# Patient Record
Sex: Male | Born: 1970 | Race: White | Hispanic: No | Marital: Single | Smoking: Never smoker
Health system: Southern US, Community
[De-identification: ages and names within clinical notes are randomized; demographics above are authoritative.]

---

## 2013-01-09 ENCOUNTER — Encounter: Payer: Self-pay | Admitting: Sports Medicine

## 2013-01-09 ENCOUNTER — Ambulatory Visit (INDEPENDENT_AMBULATORY_CARE_PROVIDER_SITE_OTHER): Payer: BC Managed Care – PPO | Admitting: Sports Medicine

## 2013-01-09 VITALS — BP 124/75 | HR 65 | Ht 70.0 in | Wt 238.0 lb

## 2013-01-09 DIAGNOSIS — Z299 Encounter for prophylactic measures, unspecified: Secondary | ICD-10-CM | POA: Insufficient documentation

## 2013-01-09 DIAGNOSIS — G4700 Insomnia, unspecified: Secondary | ICD-10-CM

## 2013-01-09 DIAGNOSIS — R6882 Decreased libido: Secondary | ICD-10-CM

## 2013-01-09 DIAGNOSIS — G479 Sleep disorder, unspecified: Secondary | ICD-10-CM | POA: Insufficient documentation

## 2013-01-09 DIAGNOSIS — E669 Obesity, unspecified: Secondary | ICD-10-CM | POA: Insufficient documentation

## 2013-01-09 DIAGNOSIS — N529 Male erectile dysfunction, unspecified: Secondary | ICD-10-CM | POA: Insufficient documentation

## 2013-01-09 MED ORDER — PHENTERMINE HCL 37.5 MG PO TABS: 37.5000 mg | ORAL_TABLET | Freq: Every day | ORAL | Status: DC

## 2013-01-09 MED ORDER — HYDROXYZINE HCL 50 MG PO TABS: 25.0000 mg | ORAL_TABLET | Freq: Every evening | ORAL | Status: DC | PRN

## 2013-01-09 MED ORDER — MELATONIN 10 MG PO TABS: 10.0000 mg | ORAL_TABLET | Freq: Every evening | ORAL | Status: DC

## 2013-01-09 NOTE — Patient Instructions (Addendum)

## 2013-01-09 NOTE — Progress Notes (Signed)
  Subjective:    CC: Establish care.   HPI: This is a pleasant healthy male who wants to discuss insomnia.  Insomnia: Lays in bed by 9 to 10:00, watches TV for approximately 3-4 hours, lays in bed for another couple of hours trying to sleep, sleeps for a couple 2-3 hours, and then awakens.  Never feels well rested through the day. He tried melatonin but takes it in the middle of the night. Symptoms are moderate, persistent. Is not tried anything else to help him sleep. Is not waking up to use the bathroom through the night.  Low sex drive: Has a young girl friend in her 18s, notes that work interferes significantly with his sex drive, he owns a Estate manager/land agent. He has taken 5 phosphodiesterase inhibitors in the past which were effective in improving his sex drive. When he is on vacation with his girlfriend, sex drive is okay. He did have testosterone levels checked in the recent past and they were normal.  Obesity: Has tried working out, dieting, would like assistance in weight loss.  Past medical history, Surgical history, Family history not pertinant except as noted below, Social history, Allergies, and medications have been entered into the medical record, reviewed, and no changes needed.   Review of Systems: No headache, visual changes, nausea, vomiting, diarrhea, constipation, dizziness, abdominal pain, skin rash, fevers, chills, night sweats, swollen lymph nodes, weight loss, chest pain, body aches, joint swelling, muscle aches, shortness of breath, mood changes, visual or auditory hallucinations.  Objective:    General: Well Developed, well nourished, and in no acute distress.  Neuro: Alert and oriented x3, extra-ocular muscles intact, sensation grossly intact.  HEENT: Normocephalic, atraumatic, pupils equal round reactive to light, neck supple, no masses, no lymphadenopathy, thyroid nonpalpable.  Skin: Warm and dry, no rashes noted.  Cardiac: Regular rate and rhythm, no murmurs rubs or  gallops.  Respiratory: Clear to auscultation bilaterally. Not using accessory muscles, speaking in full sentences.  Abdominal: Soft, nontender, nondistended, positive bowel sounds, no masses, no organomegaly.  Musculoskeletal: Shoulder, elbow, wrist, hip, knee, ankle stable, and with full range of motion. Impression and Recommendations:    The patient was counselled, risk factors were discussed, anticipatory guidance given.

## 2013-01-09 NOTE — Assessment & Plan Note (Signed)
Phentermine, nutritionist referral, return monthly for weight checks and refills.

## 2013-01-09 NOTE — Assessment & Plan Note (Signed)
Up-to-date on preventative measures, he declines tetanus or flu immunization. Lipid panel showed an LDL of 120 in 2013. Testosterone levels were normal. We can recheck this in a year.

## 2013-01-09 NOTE — Assessment & Plan Note (Signed)
Testosterone levels have been normal in the past. No signs of depression. 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 He did have improvement with 5 phosphodiesterase inhibitors, I am going to provide him with some samples. I would like to treat his insomnia I hope this will help his sex drive.

## 2013-01-09 NOTE — Assessment & Plan Note (Signed)
On initial history I do know that he watches TV for several hours when laying in bed, this likely interferes with melatonin secretion. He will avoid TV after 9 to 10:00, he will try melatonin at approximately 8:00, 10 mg, every night. He will take hydroxyzine to help cause drowsiness and decreased sleep latency. Like to discuss this with him again in about 2 months.

## 2013-02-04 ENCOUNTER — Ambulatory Visit (INDEPENDENT_AMBULATORY_CARE_PROVIDER_SITE_OTHER): Payer: BC Managed Care – PPO | Admitting: Sports Medicine

## 2013-02-04 ENCOUNTER — Encounter: Payer: Self-pay | Admitting: Sports Medicine

## 2013-02-04 VITALS — BP 139/84 | HR 64 | Wt 227.0 lb

## 2013-02-04 DIAGNOSIS — H1130 Conjunctival hemorrhage, unspecified eye: Secondary | ICD-10-CM

## 2013-02-04 DIAGNOSIS — E669 Obesity, unspecified: Secondary | ICD-10-CM

## 2013-02-04 DIAGNOSIS — R6882 Decreased libido: Secondary | ICD-10-CM

## 2013-02-04 DIAGNOSIS — H1132 Conjunctival hemorrhage, left eye: Secondary | ICD-10-CM | POA: Insufficient documentation

## 2013-02-04 MED ORDER — PHENTERMINE HCL 37.5 MG PO TABS: 37.5000 mg | ORAL_TABLET | Freq: Every day | ORAL | Status: DC

## 2013-02-04 MED ORDER — TADALAFIL 5 MG PO TABS: 5.0000 mg | ORAL_TABLET | Freq: Every day | ORAL | Status: DC | PRN

## 2013-02-04 NOTE — Assessment & Plan Note (Signed)
Excellent response to Cialis samples We can refill this with samples as needed.

## 2013-02-04 NOTE — Assessment & Plan Note (Signed)
11 pound weight loss in one month. Continue phentermine.

## 2013-02-04 NOTE — Progress Notes (Signed)
  Subjective:    CC: Followup  HPI: Decreased sexual drive: Improved significantly with Cialis, no further problems.  Obesity: 11 pound weight loss in one month since starting phentermine.  Bruised Eye: several days ago his scratched his eye, he has no pain, no problems with vision, just some redness under his left lateral conjunctiva.  Past medical history, Surgical history, Family history not pertinant except as noted below, Social history, Allergies, and medications have been entered into the medical record, reviewed, and no changes needed.   Review of Systems: No fevers, chills, night sweats, weight loss, chest pain, or shortness of breath.   Objective:    General: Well Developed, well nourished, and in no acute distress.  Neuro: Alert and oriented x3, extra-ocular muscles intact, sensation grossly intact.  HEENT: Normocephalic, atraumatic, pupils equal round reactive to light, neck supple, no masses, no lymphadenopathy, thyroid nonpalpable. There is a left lateral cell conjunctival hemorrhage but spares the limbus., No anterior chamber chemosis or hyphema. Skin: Warm and dry, no rashes. Cardiac: Regular rate and rhythm, no murmurs rubs or gallops, no lower extremity edema.  Respiratory: Clear to auscultation bilaterally. Not using accessory muscles, speaking in full sentences. Impression and Recommendations:

## 2013-02-04 NOTE — Assessment & Plan Note (Signed)
His dog scratched his eye, vision is normal, painless. This will resolve on its own.

## 2013-02-11 ENCOUNTER — Ambulatory Visit: Payer: BC Managed Care – PPO | Admitting: Dietician

## 2013-03-04 ENCOUNTER — Encounter: Payer: Self-pay | Admitting: Sports Medicine

## 2013-03-04 ENCOUNTER — Ambulatory Visit (INDEPENDENT_AMBULATORY_CARE_PROVIDER_SITE_OTHER): Payer: BC Managed Care – PPO | Admitting: Sports Medicine

## 2013-03-04 VITALS — BP 143/88 | HR 67 | Wt 221.0 lb

## 2013-03-04 DIAGNOSIS — E669 Obesity, unspecified: Secondary | ICD-10-CM

## 2013-03-04 MED ORDER — PHENTERMINE HCL 37.5 MG PO TABS: 37.5000 mg | ORAL_TABLET | Freq: Every day | ORAL | Status: DC

## 2013-03-04 NOTE — Progress Notes (Signed)
  Subjective:    CC: Follow up  HPI: Obesity: Additional 6 pound weight loss in the last month, this brings his total weight loss 17 pounds in 2 months. No adverse effects, has not yet seen the nutritionist but does have an appointment coming up, has not yet started his exercise prescription.  Past medical history, Surgical history, Family history not pertinant except as noted below, Social history, Allergies, and medications have been entered into the medical record, reviewed, and no changes needed.   Review of Systems: No fevers, chills, night sweats, weight loss, chest pain, or shortness of breath.   Objective:    General: Well Developed, well nourished, and in no acute distress.  Neuro: Alert and oriented x3, extra-ocular muscles intact, sensation grossly intact.  HEENT: Normocephalic, atraumatic, pupils equal round reactive to light, neck supple, no masses, no lymphadenopathy, thyroid nonpalpable.  Skin: Warm and dry, no rashes. Cardiac: Regular rate and rhythm, no murmurs rubs or gallops, no lower extremity edema.  Respiratory: Clear to auscultation bilaterally. Not using accessory muscles, speaking in full sentences.  Impression and Recommendations:

## 2013-03-04 NOTE — Assessment & Plan Note (Signed)
Additional 6 pound weight loss, this brings her total weight lost to 17 pounds over 2 months. Refilling, he does have a visit coming up with his nutritionist, and he is going to start working out more. Return in one month for recheck and refills.

## 2013-03-06 ENCOUNTER — Encounter: Payer: BC Managed Care – PPO | Attending: Sports Medicine | Admitting: *Deleted

## 2013-03-06 ENCOUNTER — Encounter: Payer: Self-pay | Admitting: *Deleted

## 2013-03-06 VITALS — Ht 69.0 in | Wt 224.5 lb

## 2013-03-06 DIAGNOSIS — E669 Obesity, unspecified: Secondary | ICD-10-CM | POA: Insufficient documentation

## 2013-03-06 DIAGNOSIS — Z713 Dietary counseling and surveillance: Secondary | ICD-10-CM | POA: Insufficient documentation

## 2013-03-06 NOTE — Progress Notes (Signed)
Medical Nutrition Therapy:  Appt start time: 0815 end time:  0900.  Assessment:  Patient here today for weight management counseling. He reports that he has been trying to lose weight for the last 2 months by changing diet habits and taking phentermine. He has lost about 17 pounds over that time. His goal weight is 205-210 pounds. He generally tries to eat healthy, but sometimes gets off track when work is busy (works at [employer]), [employer]. He was also exercising regularly, but has not lately due to busy work schedule. He does reports using a phone app to track his diet.   MEDICATIONS: Phentermine   DIETARY INTAKE:   Usual eating pattern includes 2-3 meals and 0-1 snacks per day.  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   Nutritional Diagnosis:  [place]-3.3 Overweight/obesity As related to history of excessive energy intake .  As evidenced by BMI 33.1.    Intervention:  Nutrition counseling. We discussed strategies for weight loss, including balancing nutrients (carbs, protein, fat), portion control, healthy snacks, and exercise.   Goals:  1. 1 pound weight loss per week. Goal weight: 205-210 pounds.  2. Balance nutrients at meals to promote satiety.  3. If snacking, choose healthy snacks.  4. Monitor portion size.  5. Get back into previous exercise regimen, 45-60 minutes 4 days weekly.   Handouts given during visit include:  1500 calorie 5 day meal plan  Yellow meal plan card  Monitoring/Evaluation:  Dietary intake,  exercise, and body weight prn.

## 2013-04-01 ENCOUNTER — Encounter: Payer: Self-pay | Admitting: Sports Medicine

## 2013-04-01 ENCOUNTER — Ambulatory Visit (INDEPENDENT_AMBULATORY_CARE_PROVIDER_SITE_OTHER): Payer: BC Managed Care – PPO | Admitting: Sports Medicine

## 2013-04-01 VITALS — BP 144/89 | HR 69 | Wt 220.0 lb

## 2013-04-01 DIAGNOSIS — E669 Obesity, unspecified: Secondary | ICD-10-CM

## 2013-04-01 MED ORDER — PHENTERMINE HCL 37.5 MG PO CAPS: 37.5000 mg | ORAL_CAPSULE | ORAL | Status: DC

## 2013-04-01 NOTE — Progress Notes (Signed)
  Subjective:    CC: Followup  HPI: Obesity: 1 pound additional weight loss, he is lost about 20 pounds total. He does see the nutritionist. He is near his goal weight. No adverse effects from the medication.  Elevated blood pressure: No headaches, visual changes, headaches.  Past medical history, Surgical history, Family history not pertinant except as noted below, Social history, Allergies, and medications have been entered into the medical record, reviewed, and no changes needed.   Review of Systems: No fevers, chills, night sweats, weight loss, chest pain, or shortness of breath.   Objective:    General: Well Developed, well nourished, and in no acute distress.  Neuro: Alert and oriented x3, extra-ocular muscles intact, sensation grossly intact.  HEENT: Normocephalic, atraumatic, pupils equal round reactive to light, neck supple, no masses, no lymphadenopathy, thyroid nonpalpable.  Skin: Warm and dry, no rashes. Cardiac: Regular rate and rhythm, no murmurs rubs or gallops, no lower extremity edema.  Respiratory: Clear to auscultation bilaterally. Not using accessory muscles, speaking in full sentences.  Impression and Recommendations:

## 2013-04-01 NOTE — Assessment & Plan Note (Signed)
1 pound additional weight loss since the last visit. He does plan on doing more aerobic exercise. Refilling phentermine, switching to capsule, he is very close to his goal weight of approximately 210 pounds.

## 2013-04-29 ENCOUNTER — Ambulatory Visit: Payer: BC Managed Care – PPO | Admitting: Sports Medicine

## 2013-05-08 ENCOUNTER — Ambulatory Visit (INDEPENDENT_AMBULATORY_CARE_PROVIDER_SITE_OTHER): Payer: BC Managed Care – PPO | Admitting: Sports Medicine

## 2013-05-08 ENCOUNTER — Encounter: Payer: Self-pay | Admitting: Sports Medicine

## 2013-05-08 VITALS — BP 131/81 | HR 72 | Ht 69.0 in | Wt 223.0 lb

## 2013-05-08 DIAGNOSIS — S60519A Abrasion of unspecified hand, initial encounter: Principal | ICD-10-CM

## 2013-05-08 DIAGNOSIS — E669 Obesity, unspecified: Secondary | ICD-10-CM

## 2013-05-08 DIAGNOSIS — L089 Local infection of the skin and subcutaneous tissue, unspecified: Secondary | ICD-10-CM | POA: Insufficient documentation

## 2013-05-08 DIAGNOSIS — IMO0002 Reserved for concepts with insufficient information to code with codable children: Secondary | ICD-10-CM

## 2013-05-08 MED ORDER — TOPIRAMATE 50 MG PO TABS: ORAL_TABLET | ORAL | Status: DC

## 2013-05-08 MED ORDER — PHENTERMINE HCL 37.5 MG PO CAPS: 37.5000 mg | ORAL_CAPSULE | ORAL | Status: DC

## 2013-05-08 MED ORDER — DOXYCYCLINE HYCLATE 100 MG PO TABS: 100.0000 mg | ORAL_TABLET | Freq: Two times a day (BID) | ORAL | Status: AC

## 2013-05-08 NOTE — Assessment & Plan Note (Signed)
Unfortunately weight loss has plateaued. He continues to be close to his goal weight of 210 pounds. Refill phentermine but adding Topamax.

## 2013-05-08 NOTE — Assessment & Plan Note (Signed)
Washes his hands constantly at work. I've advised wearing gloves and I dressed the wound with Tegaderm. There is an early bacterial superinfection, I am adding doxycycline.

## 2013-05-08 NOTE — Progress Notes (Signed)
  Subjective:    CC: Followup  HPI: Obesity: Unfortunately weight has plateaued.  Elevated blood pressure: Resolved.  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  He does not wear gloves. Pain is mild, persistent.  Past medical history, Surgical history, Family history not pertinant except as noted below, Social history, Allergies, and medications have been entered into the medical record, reviewed, and no changes needed.   Review of Systems: No fevers, chills, night sweats, weight loss, chest pain, or shortness of breath.   Objective:    General: Well Developed, well nourished, and in no acute distress.  Neuro: Alert and oriented x3, extra-ocular muscles intact, sensation grossly intact.  HEENT: Normocephalic, atraumatic, pupils equal round reactive to light, neck supple, no masses, no lymphadenopathy, thyroid nonpalpable.  Skin: Warm and dry, no rashes. Cardiac: Regular rate and rhythm, no murmurs rubs or gallops, no lower extremity edema.  Respiratory: Clear to auscultation bilaterally. Not using accessory muscles, speaking in full sentences. Right hand: There are several 0.5-1 cm sores with overlying erythema, induration, and a slight purulent exudate. The sores were dressed with Tegaderm and antibiotic ointment.  Impression and Recommendations:

## 2013-06-05 ENCOUNTER — Ambulatory Visit (INDEPENDENT_AMBULATORY_CARE_PROVIDER_SITE_OTHER): Payer: BC Managed Care – PPO | Admitting: Sports Medicine

## 2013-06-05 ENCOUNTER — Encounter: Payer: Self-pay | Admitting: Sports Medicine

## 2013-06-05 VITALS — BP 128/79 | HR 64 | Ht 70.0 in | Wt 217.0 lb

## 2013-06-05 DIAGNOSIS — E669 Obesity, unspecified: Secondary | ICD-10-CM

## 2013-06-05 MED ORDER — PHENTERMINE HCL 37.5 MG PO CAPS: 37.5000 mg | ORAL_CAPSULE | ORAL | Status: DC

## 2013-06-05 MED ORDER — TOPIRAMATE 50 MG PO TABS: 50.0000 mg | ORAL_TABLET | Freq: Every day | ORAL | Status: DC

## 2013-06-05 NOTE — Assessment & Plan Note (Signed)
I am going to add an additional 3 months of medication, I like to see him back after that, at which point we will discontinue phentermine and Topamax. His clothes are fitting significantly differently, blood pressure has normalized, and is very happy with the results.

## 2013-06-05 NOTE — Progress Notes (Signed)
  Subjective:    CC: Weight check  HPI: Obesity: Additional 6 probably lost, he is 217 pounds which is close to his 210 pound goal.  Past medical history, Surgical history, Family history not pertinant except as noted below, Social history, Allergies, and medications have been entered into the medical record, reviewed, and no changes needed.   Review of Systems: No fevers, chills, night sweats, weight loss, chest pain, or shortness of breath.   Objective:    General: Well Developed, well nourished, and in no acute distress.  Neuro: Alert and oriented x3, extra-ocular muscles intact, sensation grossly intact.  HEENT: Normocephalic, atraumatic, pupils equal round reactive to light, neck supple, no masses, no lymphadenopathy, thyroid nonpalpable.  Skin: Warm and dry, no rashes. Cardiac: Regular rate and rhythm, no murmurs rubs or gallops, no lower extremity edema.  Respiratory: Clear to auscultation bilaterally. Not using accessory muscles, speaking in full sentences.  Impression and Recommendations:

## 2013-06-17 ENCOUNTER — Ambulatory Visit: Payer: BC Managed Care – PPO | Admitting: Physician Assistant

## 2013-06-17 ENCOUNTER — Ambulatory Visit (INDEPENDENT_AMBULATORY_CARE_PROVIDER_SITE_OTHER): Payer: BC Managed Care – PPO | Admitting: Physician Assistant

## 2013-06-17 ENCOUNTER — Encounter: Payer: Self-pay | Admitting: Physician Assistant

## 2013-06-17 VITALS — BP 129/80 | HR 70 | Temp 98.2°F | Wt 220.0 lb

## 2013-06-17 DIAGNOSIS — R05 Cough: Secondary | ICD-10-CM

## 2013-06-17 DIAGNOSIS — R059 Cough, unspecified: Secondary | ICD-10-CM

## 2013-06-17 DIAGNOSIS — J209 Acute bronchitis, unspecified: Secondary | ICD-10-CM

## 2013-06-17 MED ORDER — PREDNISONE 50 MG PO TABS: ORAL_TABLET | ORAL | Status: DC

## 2013-06-17 MED ORDER — HYDROCODONE-HOMATROPINE 5-1.5 MG/5ML PO SYRP: 5.0000 mL | ORAL_SOLUTION | Freq: Every evening | ORAL | Status: DC | PRN

## 2013-06-17 MED ORDER — AZITHROMYCIN 250 MG PO TABS: ORAL_TABLET | ORAL | Status: DC

## 2013-06-17 MED ORDER — BENZONATATE 200 MG PO CAPS: 200.0000 mg | ORAL_CAPSULE | Freq: Two times a day (BID) | ORAL | Status: DC | PRN

## 2013-06-17 NOTE — Progress Notes (Signed)
   Subjective:    Patient ID: Carlos Mathews, male    DOB: 09-Jun-1970, 43 y.o.   MRN: [id number]  HPI Patient is a 43 year old male who presents to the clinic with cough that has been ongoing for the last 2 weeks. He has been treating it with over-the-counter Mucinex and cough syrup. He thought he was getting better and then 2 days ago he started to feel worse. His cough is productive with greenish sputum. He denies any fever, chills, body aches, nausea, vomiting or diarrhea. He does feel like he's had some shortness of breath and mild wheezing. He does not have a history of any asthma or lung conditions. Cough especially worse when he lays down at night when he changes from hot to cold temperatures. He has no other sick contacts. He denies any sinus pressure, ear pain or sore throat.    Review of Systems     Objective:   Physical Exam  Constitutional: He is oriented to person, place, and time. He appears well-developed and well-nourished.  HENT:  Head: Normocephalic and atraumatic.  Right Ear: External ear normal.  Left Ear: External ear normal.  Nose: Nose normal.  Mouth/Throat: Oropharynx is clear and moist.  Eyes: Conjunctivae are normal.  Neck: Normal range of motion. Neck supple.  Cardiovascular: Normal rate, regular rhythm and normal heart sounds.   Pulmonary/Chest:  Rhonchi heard throughout both lungs. Wheezing at bases bilaterally. Patient does not seem out of breath and is able to complete sentences.  Lymphadenopathy:    He has no cervical adenopathy.  Neurological: He is alert and oriented to person, place, and time.  Skin: Skin is dry.  Psychiatric: He has a normal mood and affect. His behavior is normal.          Assessment & Plan:  Acute bronchitis-treated with Z-Pak today. Patient does have a documented headache with clarithromycin that he feels like he has tolerated Z-Pak in the past. Sit over 5 days of prednisone to help with wheezing and shortness of breath.  Patient was given Jerilynn Somessalon Perles to use for cough during the day and Hycodan for cough at night. Patient is to call office if not improving or worsening.

## 2013-06-17 NOTE — Patient Instructions (Addendum)
Start zpak.  5 days of prednisone.  Tessalon pearle during the day.  Hycodan and night for cough.   Acute Bronchitis Bronchitis is inflammation of the airways that extend from the windpipe into the lungs (bronchi). The inflammation often causes mucus to develop. This leads to a cough, which is the most common symptom of bronchitis.  In acute bronchitis, the condition usually develops suddenly and goes away over time, usually in a couple weeks. Smoking, allergies, and asthma can make bronchitis worse. Repeated episodes of bronchitis may cause further lung problems.  CAUSES Acute bronchitis is most often caused by the same virus that causes a cold. The virus can spread from person to person (contagious).  SIGNS AND SYMPTOMS   Cough.   Fever.   Coughing up mucus.   Body aches.   Chest congestion.   Chills.   Shortness of breath.   Sore throat.  DIAGNOSIS  Acute bronchitis is usually diagnosed through a physical exam. Tests, such as chest X-rays, are sometimes done to rule out other conditions.  TREATMENT  Acute bronchitis usually goes away in a couple weeks. Often times, no medical treatment is necessary. Medicines are sometimes given for relief of fever or cough. Antibiotics are usually not needed but may be prescribed in certain situations. In some cases, an inhaler may be recommended to help reduce shortness of breath and control the cough. A cool mist vaporizer may also be used to help thin bronchial secretions and make it easier to clear the chest.  HOME CARE INSTRUCTIONS  Get plenty of rest.   Drink enough fluids to keep your urine clear or pale yellow (unless you have a medical condition that requires fluid restriction). Increasing fluids may help thin your secretions and will prevent dehydration.   Only take over-the-counter or prescription medicines as directed by your health care provider.   Avoid smoking and secondhand smoke. Exposure to cigarette smoke or  irritating chemicals will make bronchitis worse. If you are a smoker, consider using nicotine gum or skin patches to help control withdrawal symptoms. Quitting smoking will help your lungs heal faster.   Reduce the chances of another bout of acute bronchitis by washing your hands frequently, avoiding people with cold symptoms, and trying not to touch your hands to your mouth, nose, or eyes.   Follow up with your health care provider as directed.  SEEK MEDICAL CARE IF: Your symptoms do not improve after 1 week of treatment.  SEEK IMMEDIATE MEDICAL CARE IF:  You develop an increased fever or chills.   You have chest pain.   You have severe shortness of breath.  You have bloody sputum.   You develop dehydration.  You develop fainting.  You develop repeated vomiting.  You develop a severe headache. MAKE SURE YOU:   Understand these instructions.  Will watch your condition.  Will get help right away if you are not doing well or get worse. Document Released: 05/18/2004 Document Revised: 12/11/2012 Document Reviewed: 10/01/2012 [facility] Patient Information 2014 [employer] [place].

## 2013-09-09 ENCOUNTER — Ambulatory Visit: Payer: BC Managed Care – PPO | Admitting: Sports Medicine

## 2013-09-09 DIAGNOSIS — Z0289 Encounter for other administrative examinations: Secondary | ICD-10-CM

## 2014-06-16 ENCOUNTER — Ambulatory Visit (INDEPENDENT_AMBULATORY_CARE_PROVIDER_SITE_OTHER): Payer: Self-pay | Admitting: Sports Medicine

## 2014-06-16 ENCOUNTER — Encounter: Payer: Self-pay | Admitting: Sports Medicine

## 2014-06-16 VITALS — BP 138/61 | HR 70 | Wt 243.0 lb

## 2014-06-16 DIAGNOSIS — R6882 Decreased libido: Secondary | ICD-10-CM

## 2014-06-16 DIAGNOSIS — Z0000 Encounter for general adult medical examination without abnormal findings: Secondary | ICD-10-CM | POA: Insufficient documentation

## 2014-06-16 MED ORDER — SILDENAFIL CITRATE 20 MG PO TABS: 20.0000 mg | ORAL_TABLET | ORAL | Status: DC | PRN

## 2014-06-16 NOTE — Progress Notes (Signed)
  Subjective:    CC: Follow-up  HPI: Erectile dysfunction: Initially manifested as decreased sex drive however he is dating a woman approximately 20 years younger, and erectile function is not sufficient. Symptoms are moderate, persistent, testosterone levels were normal in the past. On further questioning he does desire to have sex however he is somewhat embarrassed when he is unable to perform adequately. He did try some of her friend's Viagra which was effective.  Past medical history, Surgical history, Family history not pertinant except as noted below, Social history, Allergies, and medications have been entered into the medical record, reviewed, and no changes needed.   Review of Systems: No fevers, chills, night sweats, weight loss, chest pain, or shortness of breath.   Objective:    General: Well Developed, well nourished, and in no acute distress.  Neuro: Alert and oriented x3, extra-ocular muscles intact, sensation grossly intact.  HEENT: Normocephalic, atraumatic, pupils equal round reactive to light, neck supple, no masses, no lymphadenopathy, thyroid nonpalpable.  Skin: Warm and dry, no rashes. Cardiac: Regular rate and rhythm, no murmurs rubs or gallops, no lower extremity edema.  Respiratory: Clear to auscultation bilaterally. Not using accessory muscles, speaking in full sentences.  Impression and Recommendations:

## 2014-06-16 NOTE — Assessment & Plan Note (Signed)
Checking routine bloodwork. 

## 2014-06-16 NOTE — Assessment & Plan Note (Signed)
Secondary to the ED. [facility] Rechecking testosterone levels.

## 2014-06-16 NOTE — Patient Instructions (Signed)
Rx sent to [facility].  

## 2014-07-14 ENCOUNTER — Ambulatory Visit: Payer: Self-pay | Admitting: Sports Medicine

## 2014-08-18 ENCOUNTER — Encounter: Payer: Self-pay | Admitting: Sports Medicine

## 2014-08-18 ENCOUNTER — Ambulatory Visit (INDEPENDENT_AMBULATORY_CARE_PROVIDER_SITE_OTHER): Payer: BLUE CROSS/BLUE SHIELD | Admitting: Sports Medicine

## 2014-08-18 VITALS — BP 134/89 | HR 65 | Ht 70.0 in | Wt 244.0 lb

## 2014-08-18 DIAGNOSIS — J3489 Other specified disorders of nose and nasal sinuses: Secondary | ICD-10-CM | POA: Diagnosis not present

## 2014-08-18 DIAGNOSIS — R6882 Decreased libido: Secondary | ICD-10-CM | POA: Diagnosis not present

## 2014-08-18 MED ORDER — MUPIROCIN CALCIUM 2 % NA OINT: 1.0000 "application " | TOPICAL_OINTMENT | Freq: Two times a day (BID) | NASAL | Status: DC

## 2014-08-18 NOTE — Assessment & Plan Note (Signed)
Adding nasal mupirocin.

## 2014-08-18 NOTE — Assessment & Plan Note (Addendum)
Fantastic response to sildenafil. No need for refills yet. Still awaiting blood work. Information given on Addyi for his wife

## 2014-08-18 NOTE — Progress Notes (Signed)
  Subjective:    CC: Follow-up  HPI: Erectile dysfunction: Doing well with low-dose sildenafil.  Nose is sore: Endorses a sore sensation with some swelling inside the medial aspect of the right nare, no bleeding, no constitutional symptoms.  Past medical history, Surgical history, Family history not pertinant except as noted below, Social history, Allergies, and medications have been entered into the medical record, reviewed, and no changes needed.   Review of Systems: No fevers, chills, night sweats, weight loss, chest pain, or shortness of breath.   Objective:    General: Well Developed, well nourished, and in no acute distress.  Neuro: Alert and oriented x3, extra-ocular muscles intact, sensation grossly intact.  HEENT: Normocephalic, atraumatic, pupils equal round reactive to light, neck supple, no masses, no lymphadenopathy, thyroid nonpalpable. Oropharynx, ear canals are unremarkable, there is some soreness in the medial aspect of the right nare. Skin: Warm and dry, no rashes. Cardiac: Regular rate and rhythm, no murmurs rubs or gallops, no lower extremity edema.  Respiratory: Clear to auscultation bilaterally. Not using accessory muscles, speaking in full sentences.  Impression and Recommendations:

## 2015-06-08 ENCOUNTER — Ambulatory Visit (INDEPENDENT_AMBULATORY_CARE_PROVIDER_SITE_OTHER): Payer: BLUE CROSS/BLUE SHIELD | Admitting: Sports Medicine

## 2015-06-08 VITALS — BP 149/92 | HR 61 | Resp 18 | Wt 242.0 lb

## 2015-06-08 DIAGNOSIS — I10 Essential (primary) hypertension: Secondary | ICD-10-CM | POA: Diagnosis not present

## 2015-06-08 DIAGNOSIS — R454 Irritability and anger: Secondary | ICD-10-CM

## 2015-06-08 DIAGNOSIS — E669 Obesity, unspecified: Secondary | ICD-10-CM | POA: Diagnosis not present

## 2015-06-08 MED ORDER — ARIPIPRAZOLE 5 MG PO TABS: 5.0000 mg | ORAL_TABLET | Freq: Every day | ORAL | Status: DC

## 2015-06-08 MED ORDER — PHENTERMINE HCL 37.5 MG PO TABS: ORAL_TABLET | ORAL | Status: DC

## 2015-06-08 NOTE — Assessment & Plan Note (Signed)
Restarting phentermine, return monthly for weight checks and refills. 

## 2015-06-08 NOTE — Assessment & Plan Note (Signed)
No overt depression however significant anger outbursts, we will add a low-dose antipsychotic, Abilify for use as a mood stabilizer. Return to see me in one month, recheck PHQ9 and GAD7, patient declines behavioral therapy.

## 2015-06-08 NOTE — Assessment & Plan Note (Signed)
We will keep a close eye on this.

## 2015-06-08 NOTE — Progress Notes (Signed)
  Subjective:    CC: Irritability  HPI: This is a pleasant 45 year old male, for the past several months he said increasing irritability associated with family issues, bursts of anger, and significant stress and anxiety. Further questioning he endorses severe anxiety, difficulty relaxing, irritability, and mild difficulty controlling his worry, worrying about different things, restlessness, and fear of impending doing, he also has severe obesity sleeping, moderate anhedonia, poor energy, mild change in appetite and difficulty concentrating, no suicidal or homicidal ideation, declines any form of cognitive behavioral therapy.  Obesity: Desires to restart phentermine.  Elevated blood pressure: Likely benign hypertension, we will keep an eye on this and he will cut back on sodium in his diet as well as stress. Recheck in one month, if no improvement we will start lisinopril, no headaches, visual changes, chest pain.  Past medical history, Surgical history, Family history not pertinant except as noted below, Social history, Allergies, and medications have been entered into the medical record, reviewed, and no changes needed.   Review of Systems: No fevers, chills, night sweats, weight loss, chest pain, or shortness of breath.   Objective:    General: Well Developed, well nourished, and in no acute distress.  Neuro: Alert and oriented x3, extra-ocular muscles intact, sensation grossly intact.  HEENT: Normocephalic, atraumatic, pupils equal round reactive to light, neck supple, no masses, no lymphadenopathy, thyroid nonpalpable.  Skin: Warm and dry, no rashes. Cardiac: Regular rate and rhythm, no murmurs rubs or gallops, no lower extremity edema.  Respiratory: Clear to auscultation bilaterally. Not using accessory muscles, speaking in full sentences.  Impression and Recommendations:    I spent 25 minutes with this patient, greater than 50% was face-to-face time counseling regarding the above  diagnoses

## 2015-07-01 ENCOUNTER — Ambulatory Visit (INDEPENDENT_AMBULATORY_CARE_PROVIDER_SITE_OTHER): Payer: BLUE CROSS/BLUE SHIELD | Admitting: Sports Medicine

## 2015-07-01 VITALS — BP 140/88 | HR 80 | Resp 18 | Wt 239.9 lb

## 2015-07-01 DIAGNOSIS — I10 Essential (primary) hypertension: Secondary | ICD-10-CM | POA: Diagnosis not present

## 2015-07-01 DIAGNOSIS — E669 Obesity, unspecified: Secondary | ICD-10-CM | POA: Diagnosis not present

## 2015-07-01 DIAGNOSIS — R454 Irritability and anger: Secondary | ICD-10-CM

## 2015-07-01 MED ORDER — CITALOPRAM HYDROBROMIDE 10 MG PO TABS: 10.0000 mg | ORAL_TABLET | Freq: Every day | ORAL | Status: DC

## 2015-07-01 NOTE — Assessment & Plan Note (Signed)
Elevated, consider weight loss before BP medications,

## 2015-07-01 NOTE — Assessment & Plan Note (Signed)
Inadequate response to Abilify, starting Celexa. Return in one month, GAD7 and PHQ9.

## 2015-07-01 NOTE — Assessment & Plan Note (Signed)
Has not yet started phentermine.

## 2015-07-01 NOTE — Progress Notes (Signed)
  Subjective:    CC: follow-up  HPI: Irritability, anxiety, depression: Did not respond to Abilify, would like to try Celexa. Symptoms are moderate, persistent, no suicidal or homicidal ideation.  Obesity: Has not yet started his phentermine.  Past medical history, Surgical history, Family history not pertinant except as noted below, Social history, Allergies, and medications have been entered into the medical record, reviewed, and no changes needed.   Review of Systems: No fevers, chills, night sweats, weight loss, chest pain, or shortness of breath.   Objective:    General: Well Developed, well nourished, and in no acute distress.  Neuro: Alert and oriented x3, extra-ocular muscles intact, sensation grossly intact.  HEENT: Normocephalic, atraumatic, pupils equal round reactive to light, neck supple, no masses, no lymphadenopathy, thyroid nonpalpable.  Skin: Warm and dry, no rashes. Cardiac: Regular rate and rhythm, no murmurs rubs or gallops, no lower extremity edema.  Respiratory: Clear to auscultation bilaterally. Not using accessory muscles, speaking in full sentences.  Impression and Recommendations:    I spent 25 minutes with this patient, greater than 50% was face-to-face time counseling regarding the above diagnoses

## 2015-07-06 ENCOUNTER — Ambulatory Visit: Payer: BLUE CROSS/BLUE SHIELD | Admitting: Sports Medicine

## 2015-07-29 ENCOUNTER — Ambulatory Visit (INDEPENDENT_AMBULATORY_CARE_PROVIDER_SITE_OTHER): Payer: BLUE CROSS/BLUE SHIELD | Admitting: Sports Medicine

## 2015-07-29 ENCOUNTER — Encounter: Payer: Self-pay | Admitting: Sports Medicine

## 2015-07-29 VITALS — BP 115/73 | HR 68 | Wt 237.9 lb

## 2015-07-29 DIAGNOSIS — R454 Irritability and anger: Secondary | ICD-10-CM

## 2015-07-29 DIAGNOSIS — E669 Obesity, unspecified: Secondary | ICD-10-CM

## 2015-07-29 DIAGNOSIS — I10 Essential (primary) hypertension: Secondary | ICD-10-CM | POA: Diagnosis not present

## 2015-07-29 MED ORDER — CITALOPRAM HYDROBROMIDE 10 MG PO TABS: 10.0000 mg | ORAL_TABLET | Freq: Every day | ORAL | Status: DC

## 2015-07-29 NOTE — Assessment & Plan Note (Signed)
Fantastic improvement in symptoms on Celexa. Refilling medication, return in 3 months.

## 2015-07-29 NOTE — Assessment & Plan Note (Signed)
Blood pressure is perfect. Diet controlled.

## 2015-07-29 NOTE — Progress Notes (Signed)
  Subjective:    CC: Follow-up  HPI: Obesity: Has not yet started phentermine  Hypertension: Now diet controlled.  Irritability: Has noted fantastic improvements in symptoms on low-dose Celexa.  Past medical history, Surgical history, Family history not pertinant except as noted below, Social history, Allergies, and medications have been entered into the medical record, reviewed, and no changes needed.   Review of Systems: No fevers, chills, night sweats, weight loss, chest pain, or shortness of breath.   Objective:    General: Well Developed, well nourished, and in no acute distress.  Neuro: Alert and oriented x3, extra-ocular muscles intact, sensation grossly intact.  HEENT: Normocephalic, atraumatic, pupils equal round reactive to light, neck supple, no masses, no lymphadenopathy, thyroid nonpalpable.  Skin: Warm and dry, no rashes. Cardiac: Regular rate and rhythm, no murmurs rubs or gallops, no lower extremity edema.  Respiratory: Clear to auscultation bilaterally. Not using accessory muscles, speaking in full sentences.  Impression and Recommendations:

## 2015-07-29 NOTE — Assessment & Plan Note (Signed)
Has not yet decided whether or not he wants to start phentermine. If he wants to start and he will make a 1 month follow-up appointment.

## 2015-10-05 ENCOUNTER — Ambulatory Visit (INDEPENDENT_AMBULATORY_CARE_PROVIDER_SITE_OTHER): Payer: BLUE CROSS/BLUE SHIELD | Admitting: Sports Medicine

## 2015-10-05 ENCOUNTER — Encounter: Payer: Self-pay | Admitting: Sports Medicine

## 2015-10-05 VITALS — BP 125/88 | HR 83 | Resp 18 | Wt 242.0 lb

## 2015-10-05 DIAGNOSIS — I10 Essential (primary) hypertension: Secondary | ICD-10-CM | POA: Diagnosis not present

## 2015-10-05 DIAGNOSIS — W57XXXA Bitten or stung by nonvenomous insect and other nonvenomous arthropods, initial encounter: Secondary | ICD-10-CM

## 2015-10-05 DIAGNOSIS — Z0000 Encounter for general adult medical examination without abnormal findings: Secondary | ICD-10-CM | POA: Diagnosis not present

## 2015-10-05 DIAGNOSIS — S0096XA Insect bite (nonvenomous) of unspecified part of head, initial encounter: Secondary | ICD-10-CM | POA: Diagnosis not present

## 2015-10-05 LAB — LIPID PANEL
Cholesterol: 217 mg/dL — ABNORMAL HIGH (ref 125–200)
HDL: 48 mg/dL (ref >=40)
LDL Cholesterol: 140 mg/dL — ABNORMAL HIGH (ref <130)
Total CHOL/HDL Ratio: 4.5 Ratio (ref <=5.0)
Triglycerides: 145 mg/dL (ref <150)
VLDL: 29 mg/dL (ref <30)

## 2015-10-05 LAB — COMPREHENSIVE METABOLIC PANEL WITH GFR
ALT: 24 U/L (ref 9–46)
AST: 17 U/L (ref 10–40)
Alkaline Phosphatase: 55 U/L (ref 40–115)
BUN: 18 mg/dL (ref 7–25)
CO2: 26 mmol/L (ref 20–31)
Creat: 1.02 mg/dL (ref 0.60–1.35)
Glucose, Bld: 89 mg/dL (ref 65–99)
Sodium: 138 mmol/L (ref 135–146)

## 2015-10-05 LAB — COMPREHENSIVE METABOLIC PANEL
Albumin: 4.4 g/dL (ref 3.6–5.1)
Calcium: 9.6 mg/dL (ref 8.6–10.3)
Chloride: 104 mmol/L (ref 98–110)
Potassium: 4.4 mmol/L (ref 3.5–5.3)
Total Bilirubin: 1.2 mg/dL (ref 0.2–1.2)
Total Protein: 7.2 g/dL (ref 6.1–8.1)

## 2015-10-05 LAB — CBC
HCT: 41.2 % (ref 38.5–50.0)
Hemoglobin: 14.4 g/dL (ref 13.2–17.1)
MCH: 29.4 pg (ref 27.0–33.0)
MCHC: 35 g/dL (ref 32.0–36.0)
MCV: 84.1 fL (ref 80.0–100.0)
MPV: 9.5 fL (ref 7.5–12.5)
Platelets: 237 K/uL (ref 140–400)
RBC: 4.9 MIL/uL (ref 4.20–5.80)
RDW: 13.9 % (ref 11.0–15.0)
WBC: 5.7 K/uL (ref 3.8–10.8)

## 2015-10-05 LAB — HEMOGLOBIN A1C
Hgb A1c MFr Bld: 5.4 % (ref <5.7)
Mean Plasma Glucose: 108 mg/dL

## 2015-10-05 LAB — HIV ANTIBODY (ROUTINE TESTING W REFLEX): HIV 1&2 Ab, 4th Generation: NONREACTIVE

## 2015-10-05 LAB — CK: Total CK: 171 U/L (ref 7–232)

## 2015-10-05 LAB — TSH: TSH: 1.27 m[IU]/L (ref 0.40–4.50)

## 2015-10-05 MED ORDER — DOXYCYCLINE HYCLATE 100 MG PO TABS: 100.0000 mg | ORAL_TABLET | Freq: Two times a day (BID) | ORAL | Status: AC

## 2015-10-05 NOTE — Assessment & Plan Note (Signed)
Appears to be a deer tick, attached for several days within his beard. [facility] Erlich[facility] titers, adding doxycycline for 14 days empirically.

## 2015-10-05 NOTE — Progress Notes (Signed)
  Subjective:    CC: Bitten by a tick  HPI: This is a pleasant 45 year old male, he recently found a tick buried within his beard, he has no idea how many days it was attached. Since then he's had some fatigue, joint aches, muscle aches. Symptoms are mild, persistent. No rash. No fevers, chills, or other constitutional symptoms.  Preventive measures: Due for routine blood work.  Hypertension: Controlled.  Irritability: Has discontinued his medication, feels the same.  Past medical history, Surgical history, Family history not pertinant except as noted below, Social history, Allergies, and medications have been entered into the medical record, reviewed, and no changes needed.   Review of Systems: No fevers, chills, night sweats, weight loss, chest pain, or shortness of breath.   Objective:    General: Well Developed, well nourished, and in no acute distress.  Neuro: Alert and oriented x3, extra-ocular muscles intact, sensation grossly intact.  HEENT: Normocephalic, atraumatic, pupils equal round reactive to light, neck supple, no masses, no lymphadenopathy, thyroid nonpalpable.  Skin: Warm and dry, no rashes. Cardiac: Regular rate and rhythm, no murmurs rubs or gallops, no lower extremity edema.  Respiratory: Clear to auscultation bilaterally. Not using accessory muscles, speaking in full sentences.  Impression and Recommendations:

## 2015-10-06 LAB — ANTI-SMITH ANTIBODY: ENA SM Ab Ser-aCnc: <1

## 2015-10-06 LAB — SJOGRENS SYNDROME-A EXTRACTABLE NUCLEAR ANTIBODY: SSA (Ro) (ENA) Antibody, IgG: <1

## 2015-10-06 LAB — JO-1 ANTIBODY-IGG: Jo-1 Antibody, IgG: <1

## 2015-10-06 LAB — ANTI-DNA ANTIBODY, DOUBLE-STRANDED: ds DNA Ab: <1 IU/mL

## 2015-10-06 LAB — LYME AB/WESTERN BLOT REFLEX: B burgdorferi Ab IgG+IgM: <0.9 Index (ref <0.90)

## 2015-10-06 LAB — SJOGRENS SYNDROME-B EXTRACTABLE NUCLEAR ANTIBODY: SSB (La) (ENA) Antibody, IgG: <1

## 2015-10-06 LAB — ANTI-RIBONUCLEIC ACID ANTIBODY: SM/RNP: <1

## 2015-10-06 LAB — ANTI-SCLERODERMA ANTIBODY: Scleroderma (Scl-70) (ENA) Antibody, IgG: <1

## 2015-10-07 LAB — ROCKY MTN SPOTTED FVR ABS PNL(IGG+IGM)
RMSF IgG: NOT DETECTED
RMSF IgM: NOT DETECTED

## 2015-10-09 LAB — EHRLICHIA ANTIBODY PANEL
E chaffeensis (HGE) Ab, IgG: <1:64 {titer}
E chaffeensis (HGE) Ab, IgM: <1:20 {titer}

## 2015-10-28 ENCOUNTER — Encounter: Payer: Self-pay | Admitting: Sports Medicine

## 2015-10-28 ENCOUNTER — Ambulatory Visit (INDEPENDENT_AMBULATORY_CARE_PROVIDER_SITE_OTHER): Payer: BLUE CROSS/BLUE SHIELD | Admitting: Sports Medicine

## 2015-10-28 VITALS — BP 124/77 | HR 69 | Ht 70.0 in | Wt 246.0 lb

## 2015-10-28 DIAGNOSIS — R454 Irritability and anger: Secondary | ICD-10-CM | POA: Diagnosis not present

## 2015-10-28 DIAGNOSIS — E669 Obesity, unspecified: Secondary | ICD-10-CM

## 2015-10-28 DIAGNOSIS — I10 Essential (primary) hypertension: Secondary | ICD-10-CM

## 2015-10-28 NOTE — Assessment & Plan Note (Signed)
Well controlled, no changes 

## 2015-10-28 NOTE — Assessment & Plan Note (Signed)
Plans to start phentermine in one week, he will return in one month for a weight check.

## 2015-10-28 NOTE — Assessment & Plan Note (Signed)
Self discontinued Celexa, no increasing irritability.

## 2015-10-28 NOTE — Progress Notes (Signed)
  Subjective:    CC: Follow-up  HPI: Obesity: Has not yet started phentermine.  Irritability: Was initially controlled on Celexa, has discontinued this, no increase in irritability.  Hypertension: Well controlled  Past medical history, Surgical history, Family history not pertinant except as noted below, Social history, Allergies, and medications have been entered into the medical record, reviewed, and no changes needed.   Review of Systems: No fevers, chills, night sweats, weight loss, chest pain, or shortness of breath.   Objective:    General: Well Developed, well nourished, and in no acute distress.  Neuro: Alert and oriented x3, extra-ocular muscles intact, sensation grossly intact.  HEENT: Normocephalic, atraumatic, pupils equal round reactive to light, neck supple, no masses, no lymphadenopathy, thyroid nonpalpable.  Skin: Warm and dry, no rashes. Cardiac: Regular rate and rhythm, no murmurs rubs or gallops, no lower extremity edema.  Respiratory: Clear to auscultation bilaterally. Not using accessory muscles, speaking in full sentences.  Impression and Recommendations:

## 2015-11-12 ENCOUNTER — Ambulatory Visit (INDEPENDENT_AMBULATORY_CARE_PROVIDER_SITE_OTHER): Payer: BLUE CROSS/BLUE SHIELD | Admitting: Sports Medicine

## 2015-11-12 ENCOUNTER — Encounter: Payer: Self-pay | Admitting: Sports Medicine

## 2015-11-12 VITALS — BP 130/73 | HR 73 | Resp 16 | Wt 242.1 lb

## 2015-11-12 DIAGNOSIS — M5136 Other intervertebral disc degeneration, lumbar region: Secondary | ICD-10-CM

## 2015-11-12 DIAGNOSIS — M51369 Other intervertebral disc degeneration, lumbar region without mention of lumbar back pain or lower extremity pain: Secondary | ICD-10-CM | POA: Insufficient documentation

## 2015-11-12 DIAGNOSIS — M5412 Radiculopathy, cervical region: Secondary | ICD-10-CM

## 2015-11-12 MED ORDER — PREDNISONE 50 MG PO TABS: ORAL_TABLET | ORAL | Status: DC

## 2015-11-12 MED ORDER — MELOXICAM 15 MG PO TABS: ORAL_TABLET | ORAL | Status: DC

## 2015-11-12 NOTE — Assessment & Plan Note (Signed)
Physical therapy, prednisone, meloxicam.  Return in one month, x-rays and MRI for interventional planning if no better.

## 2015-11-12 NOTE — Assessment & Plan Note (Signed)
Physical therapy, prednisone, meloxicam.  Return in one month, x-rays and MRI for interventional planning if no better. 

## 2015-11-12 NOTE — Progress Notes (Signed)
Patient ID: Carlos GladdenWilliam Dang, male   DOB: [birth year], 45 y.o.   MRN: [id number]   Subjective:    CC: low back pain   HPI: 45 yo M presenting with one week of low back pain.  Patient states he woke up one week ago with pain in his lower back that is described as a "stabbing" pain.  He says the pain travels down his legs and feels like a dull ache in his legs.  He takes Ibuprofen daily, which has not helped with the pain.  Pain has improved with hamstring stretches and visits to his chiropractor, but he only feels 50% back to his baseline.  He denies any red flag symptoms - no bladder or bowel incontinence, no saddle anesthesia, no fevers or night sweats.   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    Past medical history, Surgical history, Family history not pertinant except as noted below, Social history, Allergies, and medications have been entered into the medical record, reviewed, and no changes needed.   Review of Systems: No fevers, chills, night sweats, weight loss, chest pain, or shortness of breath.   Objective:    General: Well Developed, well nourished, and in no acute distress.  Neuro: Alert and oriented x3, extra-ocular muscles intact, sensation grossly intact.  HEENT: Normocephalic, atraumatic, pupils equal round reactive to light, neck supple, no masses, no lymphadenopathy, thyroid nonpalpable.  Skin: Warm and dry, no rashes. Cardiac: Regular rate and rhythm, no murmurs rubs or gallops, no lower extremity edema.  Respiratory: Clear to auscultation bilaterally. Not using accessory muscles, speaking in full sentences. Back Exam:  Inspection: Unremarkable  Motion: Flexion 45 deg, Extension 45 deg, Side Bending to 45 deg bilaterally,  Rotation to 45 deg bilaterally  SLR laying: Negative  XSLR laying: Negative  Palpable tenderness: None. Sensory change: Gross sensation  intact to all lumbar and sacral dermatomes.  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  Continues to have L shoulder pain, likely from pinched cervical nerve.   Will prescribe 5 days of steroids. Prescription for Meloxicam.  Referral to PT.  Plan to follow-up in one month - will get X-ray/MRI at that time if pain persists.

## 2015-12-01 ENCOUNTER — Encounter: Payer: Self-pay | Admitting: Rehabilitative and Restorative Service Providers"

## 2015-12-01 ENCOUNTER — Encounter (INDEPENDENT_AMBULATORY_CARE_PROVIDER_SITE_OTHER): Payer: Self-pay

## 2015-12-01 ENCOUNTER — Ambulatory Visit (INDEPENDENT_AMBULATORY_CARE_PROVIDER_SITE_OTHER): Payer: BLUE CROSS/BLUE SHIELD | Admitting: Rehabilitative and Restorative Service Providers"

## 2015-12-01 DIAGNOSIS — M542 Cervicalgia: Secondary | ICD-10-CM | POA: Diagnosis not present

## 2015-12-01 DIAGNOSIS — R29898 Other symptoms and signs involving the musculoskeletal system: Secondary | ICD-10-CM | POA: Diagnosis not present

## 2015-12-01 DIAGNOSIS — M5416 Radiculopathy, lumbar region: Secondary | ICD-10-CM | POA: Diagnosis not present

## 2015-12-01 NOTE — Patient Instructions (Signed)
Extension    Lie face down, hands close to chest. Press trunk up, arching back. Keep neck long, shoulders down. Tighten buttocks to protect lower back. Hold _2-3___ seconds. Repeat _10___ times. Do __2__ sessions per day.   Hamstring Step 1    Straighten left knee. Keep knee level with other knee or on bolster. Hold _30__ seconds. Relax knee by returning foot to start. Repeat _2__ times, 1x/day.  Piriformis Stretch   Lying on back, pull right knee toward opposite shoulder. Hold 30 seconds. Repeat 3 times. Do 2-3 sessions per day.  KNEE: Quadriceps - Prone    Place strap around ankle. Bring ankle toward buttocks. Press hip into surface. Hold __30_ seconds. _2__ reps per set   Scapula Adduction With Pectorals, Mid-Range   Stand in doorframe with palms against frame and arms at 90. Lean forward and squeeze shoulder blades. Hold _30__ seconds. Repeat _2__ times per session. Do __2_ sessions per day. \Scapula Adduction With Pectorals, High   Stand in doorframe with palms against frame and arms at 120. Lean forward and squeeze shoulder blades. Hold _30__ seconds. Repeat _2__ times per session. Do _2__ sessions per day.  Sleeping on Back  Place pillow under knees. A pillow with cervical support and a roll around waist are also helpful. Copyright  [employer]. All rights reserved.  Sleeping on Side Place pillow between knees. Use cervical support under neck and a roll around waist as needed. Copyright  VHI. All rights reserved.   Sleeping on Stomach   If this is the only desirable sleeping position, place pillow under lower legs, and under stomach or chest as needed.  Posture - Sitting   Sit upright, head facing forward. Try using a roll to support lower back. Keep shoulders relaxed, and avoid rounded back. Keep hips level with knees. Avoid crossing legs for long periods. Stand to Sit / Sit to Stand   To sit: Bend knees to lower self onto front edge of chair, then scoot  back on seat. To stand: Reverse sequence by placing one foot forward, and scoot to front of seat. Use rocking motion to stand up.   Work Height and Reach  Ideal work height is no more than 2 to 4 inches below elbow level when standing, and at elbow level when sitting. Reaching should be limited to arm's length, with elbows slightly bent.  Bending  Bend at hips and knees, not back. Keep feet shoulder-width apart.    Posture - Standing   Good posture is important. Avoid slouching and forward head thrust. Maintain curve in low back and align ears over shoul- ders, hips over ankles.  Alternating Positions   Alternate tasks and change positions frequently to reduce fatigue and muscle tension. Take rest breaks. Computer Work   Position work to Art gallery manager. Use proper work and seat height. Keep shoulders back and down, wrists straight, and elbows at right angles. Use chair that provides full back support. Add footrest and lumbar roll as needed.  Getting Into / Out of Car  Lower self onto seat, scoot back, then bring in one leg at a time. Reverse sequence to get out.  Dressing  Lie on back to pull socks or slacks over feet, or sit and bend leg while keeping back straight.    Housework - Sink  Place one foot on ledge of cabinet under sink when standing at sink for prolonged periods.   Pushing / Pulling  Pushing is preferable to pulling. Keep back in proper  alignment, and use leg muscles to do the work.  Deep Squat   Squat and lift with both arms held against upper trunk. Tighten stomach muscles without holding breath. Use smooth movements to avoid jerking.  Avoid Twisting   Avoid twisting or bending back. Pivot around using foot movements, and bend at knees if needed when reaching for articles.  Carrying Luggage   Distribute weight evenly on both sides. Use a cart whenever possible. Do not twist trunk. Move body as a unit.   Lifting Principles .Maintain proper  posture and head alignment. .Slide object as close as possible before lifting. .Move obstacles out of the way. .Test before lifting; ask for help if too heavy. .Tighten stomach muscles without holding breath. .Use smooth movements; do not jerk. .Use legs to do the work, and pivot with feet. .Distribute the work load symmetrically and close to the center of trunk. .Push instead of pull whenever possible.   Ask For Help   Ask for help and delegate to others when possible. Coordinate your movements when lifting together, and maintain the low back curve.  Log Roll   Lying on back, bend left knee and place left arm across chest. Roll all in one movement to the right. Reverse to roll to the left. Always move as one unit. Housework - Sweeping  Use long-handled equipment to avoid stooping.   Housework - Wiping  Position yourself as close as possible to reach work surface. Avoid straining your back.  Laundry - Unloading Wash   To unload small items at bottom of washer, lift leg opposite to arm being used to reach.  Gardening - Raking  Move close to area to be raked. Use arm movements to do the work. Keep back straight and avoid twisting.     Cart  When reaching into cart with one arm, lift opposite leg to keep back straight.   Getting Into / Out of Bed  Lower self to lie down on one side by raising legs and lowering head at the same time. Use arms to assist moving without twisting. Bend both knees to roll onto back if desired. To sit up, start from lying on side, and use same move-ments in reverse. Housework - Vacuuming  Hold the vacuum with arm held at side. Step back and forth to move it, keeping head up. Avoid twisting.   Laundry - Armed forces training and education officerLoading Wash  Position laundry basket so that bending and twisting can be avoided.   Laundry - Unloading Dryer  Squat down to reach into clothes dryer or use a reacher.  Gardening - Weeding / Psychiatric nurselanting  Squat or Kneel. Knee pads may be  helpful.                   TENS UNIT: This is helpful for muscle pain and spasm.   Search and Purchase a TENS 7000 2nd edition at www.tenspros.com. It should be less than $30.     TENS unit instructions: Do not shower or bathe with the unit on Turn the unit off before removing electrodes or batteries If the electrodes lose stickiness add a drop of water to the electrodes after they are disconnected from the unit and place on plastic sheet. If you continued to have difficulty, call the TENS unit company to purchase more electrodes. Do not apply lotion on the skin area prior to use. Make sure the skin is clean and dry as this will help prolong the life of the electrodes. After use, always check  skin for unusual red areas, rash or other skin difficulties. If there are any skin problems, does not apply electrodes to the same area. Never remove the electrodes from the unit by pulling the wires. Do not use the TENS unit or electrodes other than as directed. Do not change electrode placement without consultating your therapist or physician. Keep 2 fingers with between each electrode. Wear time ratio is 2:1, on to off times.    For example on for 30 minutes off for 15 minutes and then on for 30 minutes off for 15 minutes   [facility] [address] [place], [place] [address]  [phone] (office) [phone] (fax)

## 2015-12-01 NOTE — Therapy (Addendum)
[facility] [place] [address] [place], [place], [address] Phone: [phone]   Fax:  [phone]  Physical Therapy Evaluation  Patient Details  Name: Carlos Mathews MRN: [id number] Date of Birth: January 17, 1971 Referring Provider: Dr. [name]   Encounter Date: 12/01/2015      PT End of Session - 12/01/15 0803    Visit Number 1   Number of Visits 12   Date for PT Re-Evaluation 01/12/16   PT Start Time 0800   PT Stop Time 0857   PT Time Calculation (min) 57 min   Activity Tolerance Patient tolerated treatment well      History reviewed. No pertinent past medical history.  History reviewed. No pertinent surgical history.  There were no vitals filed for this visit.       Subjective Assessment - 12/01/15 0806    Subjective Carlos Mathews reports that he began having low back pain shooting down into the hip/buttock area and pain in the Lt shoulder - noticed that his arm was "going numb". Now having tightness on either side of the spine through the low back. He was seen by MD and treated with meds with some impvoement. He has also started working out again.   Pertinent History Some LBP in his early 59's treated with chiropractic care with full resolution.    How long can you sit comfortably? no limit now   How long can you stand comfortably? 10-15 min    How long can you walk comfortably? 20-30 min    Patient Stated Goals get rid of pain    Currently in Pain? Yes   Pain Score 5    Pain Location Back   Pain Orientation Left;Right;Lower   Pain Descriptors / Indicators Dull;Sharp   Pain Type Acute pain   Pain Radiating Towards spasm on the Rt side into the hip/buttock - - at times knees with ache when pain is at its worst    Pain Onset More than a month ago   Pain Frequency Intermittent   Aggravating Factors  prolonged standing; lifting; bending; activity; moving from sitting to standing and walking after he has been sitting for a  short time    Pain Relieving Factors lying on either side; sitting shifted to the side             [facility] PT Assessment - 12/01/15 0001      Assessment   Medical Diagnosis Lumbar radiculopathy/cervical radiculopathy    Referring Provider Dr. [name]    Onset Date/Surgical Date 10/02/15   Hand Dominance Right   Next MD Visit 12/20/15   Prior Therapy none     Precautions   Precautions None     Balance Screen   Has the patient fallen in the past 6 months No   Has the patient had a decrease in activity level because of a fear of falling?  No   Is the patient reluctant to leave their home because of a fear of falling?  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   Leisure yard Teacher, English as a foreign language some at gym - machines some cardio - 3 times a week started back 2 weeks ago      Observation/Other Assessments   Focus on  Therapeutic Outcomes (FOTO)  48% limitation      Sensation   Additional Comments Lt UE - whole arm mainly in fingers      Posture/Postural Control   Posture Comments head forward; shoulders rounded and elevated; increased thoracici kyphosis; flexed forward at hips      AROM   Right/Left Shoulder --  WFL's - tight end range elevation    Right/Left Hip --  tight end ranges throughout Lt > Rt    Cervical Flexion 60   Cervical Extension 48   Cervical - Right Side Bend 44   Cervical - Left Side Bend 42   Cervical - Right Rotation 61   Cervical - Left Rotation 64   Lumbar Flexion 55%  pain across LB increased with repeated bending    Lumbar Extension 45%   Lumbar - Right Side Bend 65%   Lumbar - Left Side Bend 55%  pain across LB   Lumbar - Right Rotation 40%   Lumbar - Left Rotation 40%      Strength   Overall Strength Comments 5/5 U/LE's      Flexibility   Hamstrings Rt 55 deg; Lt 45 deg    Quadriceps tight Rt 115 deg; Lt 110 deg    ITB tight    Piriformis tight LT >> Rt      Palpation   Spinal mobility tight L3/4/5 with CPA mobs; cervical C4/5/6/7 tightness with CPA mobs    Palpation comment tightness bilat lumbar paraspinals/QL/piriformis Lt > Rt; bilat cervical musculature ant/lat/post; upper trap; pecs Lt > Rt                    [facility] Adult PT Treatment/Exercise - 12/01/15 0001      Self-Care   Self-Care --  initiated spine care education      Exercises   Exercises Lumbar;Shoulder;Knee/Hip     Lumbar Exercises: Stretches   Passive Hamstring Stretch 2 reps;30 seconds   Press Ups --  3 sec hold, 10 reps   Quad Stretch 3 reps;30 seconds   Piriformis Stretch 30 seconds  3 reps supine, 3 reps seated     Shoulder Exercises: Stretch   Other Shoulder Stretches Doorway stretch 3 positions each 30 sec      Modalities   Modalities Electrical Stimulation;Cryotherapy     Cryotherapy   Number Minutes Cryotherapy 15 Minutes   Cryotherapy Location Lumbar Spine   Type of Cryotherapy Ice pack     Electrical Stimulation   Electrical Stimulation Location lumbar paraspinals   Electrical Stimulation Action IFC   Electrical Stimulation Parameters  to tolerance, 15 min    Electrical Stimulation Goals Pain                PT Education - 12/01/15 0850    Education provided --             PT Long Term Goals - 12/01/15 0846      PT LONG TERM GOAL #1   Title Improve posture and alignment with patient to demonstrate more upright position for head/neck/shoulders 01/12/16   Time 6   Period Weeks   Status New     PT LONG TERM GOAL #2   Title Improve tissue extensibility through upper and lower extremities and trunk to allow improved ergonomics/body mechanics 01/12/16   Time 6   Period Weeks   Status New     PT LONG TERM GOAL #3   Title  Improve core stability/strength allowing patient to  progress with appropriate gym program safely 01/12/16   Time 6   Period Weeks   Status New     PT LONG TERM GOAL #4   Title Independent in HEP 01/12/16   Time 6   Period Weeks   Status New     PT LONG TERM GOAL #5   Title Improve FOTO to </= 30% limitation 01/12/16   Time 6   Period Weeks   Status New               Plan - 12/01/15 0842    Clinical Impression Statement Carlos Mathews presents with lumbar and cervical dysfunction including poor posture and alignment; poor core stability; limited trunk and LE mobility and ROM; muscular tightness to palpation; poor body mechanics and ergonomics with work and home activities.    Rehab Potential Good   PT Frequency 2x / week   PT Duration 6 weeks   PT Next Visit Plan core stabilization; selective stretching; axial extension; lumbar extension program - avoid flexion; manual work v TDN for cervical tightness and Lt piriformis/hip abductors; modalities as indicated    Consulted and Agree with Plan of Care Patient      Patient will benefit from skilled therapeutic intervention in order to improve the following deficits and impairments:  Postural dysfunction, Improper body mechanics, Pain, Increased fascial restricitons, Increased muscle spasms, Decreased mobility, Decreased range of motion, Decreased strength, Decreased activity tolerance  Visit Diagnosis: Radiculopathy, lumbar region - Plan: PT plan of care cert/re-cert  Other symptoms and signs involving the musculoskeletal system - Plan: PT plan of care cert/re-cert  Cervicalgia - Plan: PT plan of care cert/re-cert     Problem List Patient Active Problem List   Diagnosis Date Noted  . Left cervical radiculopathy 11/12/2015  . Lumbar degenerative disc disease 11/12/2015  . Tick bite of head 10/05/2015  . Irritability 06/08/2015  . Essential hypertension, benign 06/08/2015  . Annual physical exam 06/16/2014  . Insomnia 01/09/2013   . Decreased sex drive 16/10/960409/18/2014  . Obesity 01/09/2013    Loveta Dellis Rober MinionP Einer Meals PT, MPH  12/01/2015, 1:29 PM  [facility] [address] [place], [place], [address] Phone: [phone]   Fax:  [phone]  Name: Claris GladdenWilliam Clegg MRN: [id number] Date of Birth: January 31, 1971

## 2015-12-08 ENCOUNTER — Ambulatory Visit (INDEPENDENT_AMBULATORY_CARE_PROVIDER_SITE_OTHER): Payer: BLUE CROSS/BLUE SHIELD | Admitting: Physical Therapy

## 2015-12-08 DIAGNOSIS — M5416 Radiculopathy, lumbar region: Secondary | ICD-10-CM | POA: Diagnosis not present

## 2015-12-08 DIAGNOSIS — R29898 Other symptoms and signs involving the musculoskeletal system: Secondary | ICD-10-CM | POA: Diagnosis not present

## 2015-12-08 DIAGNOSIS — M542 Cervicalgia: Secondary | ICD-10-CM

## 2015-12-08 NOTE — Patient Instructions (Signed)
AROM: Lateral Neck Flexion    Slowly tilt head toward one shoulder, then the other. Hold each position __30__ seconds. (can sit on hands to anchor shoulder) Repeat __2__ times per set. Do _1___ sets per session. Do __2__ sessions per day.  Resisted External Rotation: in Neutral - Bilateral   PALMS UP Sit or stand, tubing in both hands, elbows at sides, bent to 90, forearms forward. Pinch shoulder blades together and rotate forearms out. Keep elbows at sides. Repeat __10__ times per set. Do _2-3___ sets per session. Do _2-3___ sessions per day.   Scapular Retraction: Abduction (Prone)    Lie with upper arms straight out from sides, elbows bent to 90. Pinch shoulder blades together and raise arms a few inches from floor. Lift forehead off of towel.  Repeat ___10_ times per set. Do __2__ sets per session. Do _1___ sessions per day.   [facility] [address] [place], [place] [address]  [phone] (office) [phone] (fax)

## 2015-12-08 NOTE — Therapy (Signed)
[facility] [address] [place], [place], [address] Phone: [phone]   Fax:  [phone]  Physical Therapy Treatment  Patient Details  Name: Carlos GladdenWilliam Mathews MRN: [id number] Date of Birth: [birth year] Referring Provider: Dr. Briant Siteshekkekandem  Encounter Date: 12/08/2015      PT End of Session - 12/08/15 0804    Visit Number 2   Number of Visits 12   Date for PT Re-Evaluation 01/12/16   PT Start Time 0800   PT Stop Time 0846   PT Time Calculation (min) 46 min   Activity Tolerance Patient tolerated treatment well;No increased pain      No past medical history on file.  No past surgical history on file.  There were no vitals filed for this visit.      Subjective Assessment - 12/08/15 0805    Subjective Pt reports he has been performing HEP every morning, "the evenings are a little more difficult due to travel".   Pain has decreased significantly, but now his main complaint is of his Lt hand going numb with driving or resting his arm on the window.     Currently in Pain? Yes   Pain Score 1    Pain Location Back   Pain Orientation Right;Left;Lower   Pain Descriptors / Indicators Sore   Aggravating Factors  not stretching   Pain Relieving Factors stretching.             [facility] PT Assessment - 12/08/15 0001      Assessment   Medical Diagnosis Lumbar radiculopathy/cervical radiculopathy    Referring Provider Dr. Briant Siteshekkekandem   Onset Date/Surgical Date 10/02/15   Hand Dominance Right   Next MD Visit 12/10/15   Prior Therapy none     Flexibility   Hamstrings Rt 80 deg, Lt 75 deg     Quadriceps Each side ~132-135 deg (heel to buttocks)                     [facility] Adult PT Treatment/Exercise - 12/08/15 0001      Lumbar Exercises: Stretches   Passive Hamstring Stretch 2 reps  45 sec, each leg.  supine/ with strap   Press Ups --  5 reps, held for 5 sec    Quad Stretch 2 reps;30 seconds   Piriformis  Stretch 4 reps;30 seconds  each leg      Lumbar Exercises: Aerobic   Stationary Bike NuStep L5: 5 min (arms / legs)      Lumbar Exercises: Prone   Opposite Arm/Leg Raise Right arm/Left leg;Left arm/Right leg  8 reps each side     Shoulder Exercises: Seated   External Rotation Strengthening;Both;10 reps;Theraband   Theraband Level (Shoulder External Rotation) Level 2 (Red);Level 3 (Green)   Other Seated Exercises scap squeeze x 5 sec x 10 reps      Shoulder Exercises: Prone   Other Prone Exercises scap retraction/axial extension with goal post arms x 10 reps      Shoulder Exercises: Stretch   Other Shoulder Stretches Doorway stretch 3 positions each 30 sec    Other Shoulder Stretches seated upper trap stretch x 30 sec x 2 reps each side, levator stretch x 30 sec x 1 rep each side     Modalities   Modalities --  pt declined, painfree at end of session                PT Education - 12/08/15 0843    Education provided Yes  Education Details HEP   Person(s) Educated Patient   Methods Explanation;Demonstration;Tactile cues;Verbal cues;Handout   Comprehension Verbalized understanding;Returned demonstration             PT Long Term Goals - 12/08/15 0847      PT LONG TERM GOAL #1   Title Improve posture and alignment with patient to demonstrate more upright position for head/neck/shoulders 01/12/16   Time 6   Period Weeks   Status On-going     PT LONG TERM GOAL #2   Title Improve tissue extensibility through upper and lower extremities and trunk to allow improved ergonomics/body mechanics 01/12/16   Time 6   Period Weeks   Status On-going     PT LONG TERM GOAL #3   Title Improve core stability/strength allowing patient to progress with appropriate gym program safely 01/12/16   Time 6   Period Weeks   Status On-going     PT LONG TERM GOAL #4   Title Independent in HEP 01/12/16   Time 6   Period Weeks   Status On-going     PT LONG TERM GOAL #5   Title  Improve FOTO to </= 30% limitation 01/12/16   Time 6   Period Weeks   Status On-going               Plan - 12/08/15 1156    Clinical Impression Statement Pt demonstrated significant improvement in quad/hamstring flexibility since last visit.  Overall pain has reduced.  Pt tolerated all exercises without increase in symptoms and declined modalities as he was pain free at end of session. Progressing towards goals.    Rehab Potential Good   PT Frequency 2x / week   PT Duration 6 weeks   PT Next Visit Plan Review new exercises.  Continue lumbar ext program, and postural strengthening.  Modalities as indicated.    Consulted and Agree with Plan of Care Patient      Patient will benefit from skilled therapeutic intervention in order to improve the following deficits and impairments:  Postural dysfunction, Improper body mechanics, Pain, Increased fascial restricitons, Increased muscle spasms, Decreased mobility, Decreased range of motion, Decreased strength, Decreased activity tolerance  Visit Diagnosis: Radiculopathy, lumbar region  Other symptoms and signs involving the musculoskeletal system  Cervicalgia     Problem List Patient Active Problem List   Diagnosis Date Noted  . Left cervical radiculopathy 11/12/2015  . Lumbar degenerative disc disease 11/12/2015  . Tick bite of head 10/05/2015  . Irritability 06/08/2015  . Essential hypertension, benign 06/08/2015  . Annual physical exam 06/16/2014  . Insomnia 01/09/2013  . Decreased sex drive 16/10/960409/18/2014  . Obesity 01/09/2013   Mayer CamelJennifer Carlson-Long, PTA 12/08/15 11:58 AM  [facility] [address] [place], [place], [address] Phone: [phone]   Fax:  [phone]  Name: Carlos GladdenWilliam Mathews MRN: [id number] Date of Birth: Feb 01, 1971

## 2015-12-10 ENCOUNTER — Ambulatory Visit (INDEPENDENT_AMBULATORY_CARE_PROVIDER_SITE_OTHER): Payer: BLUE CROSS/BLUE SHIELD | Admitting: Sports Medicine

## 2015-12-10 ENCOUNTER — Ambulatory Visit (INDEPENDENT_AMBULATORY_CARE_PROVIDER_SITE_OTHER): Payer: BLUE CROSS/BLUE SHIELD | Admitting: Physical Therapy

## 2015-12-10 ENCOUNTER — Encounter: Payer: Self-pay | Admitting: Sports Medicine

## 2015-12-10 DIAGNOSIS — M5416 Radiculopathy, lumbar region: Secondary | ICD-10-CM

## 2015-12-10 DIAGNOSIS — R29898 Other symptoms and signs involving the musculoskeletal system: Secondary | ICD-10-CM

## 2015-12-10 DIAGNOSIS — M542 Cervicalgia: Secondary | ICD-10-CM | POA: Diagnosis not present

## 2015-12-10 DIAGNOSIS — M5412 Radiculopathy, cervical region: Secondary | ICD-10-CM | POA: Diagnosis not present

## 2015-12-10 DIAGNOSIS — M5136 Other intervertebral disc degeneration, lumbar region: Secondary | ICD-10-CM | POA: Diagnosis not present

## 2015-12-10 DIAGNOSIS — M51369 Other intervertebral disc degeneration, lumbar region without mention of lumbar back pain or lower extremity pain: Secondary | ICD-10-CM

## 2015-12-10 NOTE — Assessment & Plan Note (Signed)
Goes to all fingers. Also with some periscapular radiculopathy. Improved a bit with physical therapy. We are to proceed with x-ray and MRI for epidural planning.

## 2015-12-10 NOTE — Assessment & Plan Note (Signed)
Approved with conservative measures, prednisone, Mobic. Return as needed for this.

## 2015-12-10 NOTE — Therapy (Addendum)
[place], [place], [address] Phone: [phone]   Fax:  [phone]  Physical Therapy Treatment  Patient Details  Name: Romelle Reiley MRN: [id number] Date of Birth: [birth year] Referring Provider: Dr. Helane Rima  Encounter Date: 12/10/2015      PT End of Session - 12/10/15 0845    Visit Number 3   Number of Visits 12   Date for PT Re-Evaluation 01/12/16   PT Start Time 0804   PT Stop Time 1751   PT Time Calculation (min) 53 min   Activity Tolerance Patient tolerated treatment well;No increased pain      No past medical history on file.  No past surgical history on file.  There were no vitals filed for this visit.      Subjective Assessment - 12/10/15 0807    Subjective Pt reports he has been doing stretches for back and legs; his back has not even bothered him. "My biggest complaint is my shoulder and arm going to sleep when I drive".    Currently in Pain? No/denies            [facility] PT Assessment - 12/10/15 0001      Assessment   Medical Diagnosis Lumbar radiculopathy/cervical radiculopathy    Referring Provider Dr. Helane Rima   Onset Date/Surgical Date 10/02/15   Hand Dominance Right   Next MD Visit 12/10/15   Prior Therapy none     AROM   Lumbar Flexion to shins    Lumbar Extension WNL   Lumbar - Right Side Bend WNL   Lumbar - Left Side Bend WNL   Lumbar - Right Rotation WNL   Lumbar - Left Rotation WNL                     [facility] Adult PT Treatment/Exercise - 12/10/15 0001      Self-Care   Self-Care --     Lumbar Exercises: Stretches   Passive Hamstring Stretch 2 reps;30 seconds  each side, seated.    Piriformis Stretch 2 reps;30 seconds     Lumbar Exercises: Aerobic   UBE (Upper Arm Bike) L3:  2 min forward/ 1 min backward. (standing on BOSU)     Shoulder Exercises: Prone   Flexion Left;10 reps   Horizontal ABduction 1 Strengthening;Left;10  reps  palm flat x 10, thumb up x 10     Shoulder Exercises: Standing   External Rotation Both;20 reps;Theraband   Flexion Strengthening;Right;Left;10 reps;Weights  with scap squeeze   Shoulder Flexion Weight (lbs) 2   ABduction Strengthening;Right;Left;10 reps;Weights  with scap squeeze   Shoulder ABduction Weight (lbs) 2   Extension Strengthening;Both;20 reps;Theraband   Theraband Level (Shoulder Extension) Level 3 (Green)   Row Strengthening;Both;10 reps;Theraband   Theraband Level (Shoulder Row) Level 3 (Green)     Shoulder Exercises: Stretch   Cross Chest Stretch 2 reps;10 seconds   Other Shoulder Stretches Doorway stretch 3 positions each 30 sec    Other Shoulder Stretches seated upper trap stretch x 30 sec x 2 reps each side, levator stretch x 30 sec x 1 rep each side     Modalities   Modalities Electrical Stimulation;Moist Heat     Moist Heat Therapy   Number Minutes Moist Heat 12 Minutes   Moist Heat Location --  thoracic spine     Electrical Stimulation   Electrical Stimulation Location Lt thoracic paraspinals/ rhomboid    Electrical Stimulation Action IFC   Electrical Stimulation Parameters  to tolerance    Electrical Stimulation Goals Pain     Manual Therapy   Manual Therapy Myofascial release;Soft tissue mobilization   Manual therapy comments pt prone   Soft tissue mobilization to Lt thoracic paraspinals.    Myofascial Release to Lt lat, lower trap, mid trap.                      PT Long Term Goals - 12/08/15 0847      PT LONG TERM GOAL #1   Title Improve posture and alignment with patient to demonstrate more upright position for head/neck/shoulders 01/12/16   Time 6   Period Weeks   Status On-going     PT LONG TERM GOAL #2   Title Improve tissue extensibility through upper and lower extremities and trunk to allow improved ergonomics/body mechanics 01/12/16   Time 6   Period Weeks   Status On-going     PT LONG TERM GOAL #3   Title  Improve core stability/strength allowing patient to progress with appropriate gym program safely 01/12/16   Time 6   Period Weeks   Status On-going     PT LONG TERM GOAL #4   Title Independent in HEP 01/12/16   Time 6   Period Weeks   Status On-going     PT LONG TERM GOAL #5   Title Improve FOTO to </= 30% limitation 01/12/16   Time 6   Period Weeks   Status On-going               Plan - 12/10/15 0846    Clinical Impression Statement Pt demonstrated improved lumbar ROM.  Pt has point tenderness in Lt rhomboid/thoracic paraspinals.  He is challenged with rhomboid/lower trap exercises for Lt shoulder.  He tolerated all exercises without any pain.       Patient will benefit from skilled therapeutic intervention in order to improve the following deficits and impairments:     Visit Diagnosis: Cervicalgia  Radiculopathy, lumbar region  Other symptoms and signs involving the musculoskeletal system     Problem List Patient Active Problem List   Diagnosis Date Noted  . Left cervical radiculopathy 11/12/2015  . Lumbar degenerative disc disease 11/12/2015  . Tick bite of head 10/05/2015  . Irritability 06/08/2015  . Essential hypertension, benign 06/08/2015  . Annual physical exam 06/16/2014  . Insomnia 01/09/2013  . Decreased sex drive 01/09/2013  . Obesity 01/09/2013    Carlson-Long, PTA 12/10/15 9:16 AM  [facility] [address] [place], [place], [address] Phone: [phone]   Fax:  [phone]  Name: Xeng Blaschke MRN: [id number] Date of Birth: 09/03/1970  PHYSICAL THERAPY DISCHARGE SUMMARY  Visits from Start of Care: 3  Current functional level related to goals / functional outcomes: See last progress note for status at PT discharge. Current level of function unknown   Remaining deficits: unknown   Education / Equipment: HEP Plan: Patient agrees to discharge.  Patient goals were  not met. Patient is being discharged due to not returning since the last visit.  ?????    Celyn P. Holt PT, MPH 03/20/16 9:53 AM   

## 2015-12-10 NOTE — Progress Notes (Signed)
  Subjective:    CC: Follow-up  HPI: Lumbar degenerative disc disease: Pain resolved.  Left cervical radiculopathy: Improved, periscapular symptoms are on with needling. Would like to do some more physical therapy before considering intervention however he is ready to proceed with advanced imaging.  Past medical history, Surgical history, Family history not pertinant except as noted below, Social history, Allergies, and medications have been entered into the medical record, reviewed, and no changes needed.   Review of Systems: No fevers, chills, night sweats, weight loss, chest pain, or shortness of breath.   Objective:    General: Well Developed, well nourished, and in no acute distress.  Neuro: Alert and oriented x3, extra-ocular muscles intact, sensation grossly intact.  HEENT: Normocephalic, atraumatic, pupils equal round reactive to light, neck supple, no masses, no lymphadenopathy, thyroid nonpalpable.  Skin: Warm and dry, no rashes. Cardiac: Regular rate and rhythm, no murmurs rubs or gallops, no lower extremity edema.  Respiratory: Clear to auscultation bilaterally. Not using accessory muscles, speaking in full sentences. Neck: Negative spurling's Full neck range of motion Grip strength and sensation normal in bilateral hands Strength good C4 to T1 distribution No sensory change to C4 to T1 Reflexes normal Negative Finkelstein, tinel's and phalens. Negative Watson's test.  Impression and Recommendations:    Left cervical radiculopathy Goes to all fingers. Also with some periscapular radiculopathy. Improved a bit with physical therapy. We are to proceed with x-ray and MRI for epidural planning.  Lumbar degenerative disc disease Approved with conservative measures, prednisone, Mobic. Return as needed for this.  I spent 25 minutes with this patient, greater than 50% was face-to-face time counseling regarding the above diagnoses

## 2015-12-21 ENCOUNTER — Ambulatory Visit (INDEPENDENT_AMBULATORY_CARE_PROVIDER_SITE_OTHER): Payer: BLUE CROSS/BLUE SHIELD

## 2015-12-21 ENCOUNTER — Other Ambulatory Visit: Payer: Self-pay | Admitting: Sports Medicine

## 2015-12-21 DIAGNOSIS — S91301S Unspecified open wound, right foot, sequela: Secondary | ICD-10-CM | POA: Diagnosis not present

## 2015-12-21 DIAGNOSIS — W3400XS Accidental discharge from unspecified firearms or gun, sequela: Secondary | ICD-10-CM

## 2015-12-21 DIAGNOSIS — Z1389 Encounter for screening for other disorder: Secondary | ICD-10-CM

## 2015-12-21 DIAGNOSIS — Z1810 Retained metal fragments, unspecified: Secondary | ICD-10-CM

## 2015-12-21 DIAGNOSIS — M542 Cervicalgia: Secondary | ICD-10-CM | POA: Diagnosis not present

## 2015-12-21 DIAGNOSIS — M5412 Radiculopathy, cervical region: Secondary | ICD-10-CM | POA: Diagnosis not present

## 2015-12-28 ENCOUNTER — Ambulatory Visit (INDEPENDENT_AMBULATORY_CARE_PROVIDER_SITE_OTHER): Payer: BLUE CROSS/BLUE SHIELD

## 2015-12-28 DIAGNOSIS — M4802 Spinal stenosis, cervical region: Secondary | ICD-10-CM

## 2015-12-28 DIAGNOSIS — M5021 Other cervical disc displacement,  high cervical region: Secondary | ICD-10-CM | POA: Diagnosis not present

## 2015-12-28 DIAGNOSIS — M5412 Radiculopathy, cervical region: Secondary | ICD-10-CM

## 2015-12-28 DIAGNOSIS — M50223 Other cervical disc displacement at C6-C7 level: Secondary | ICD-10-CM | POA: Diagnosis not present

## 2015-12-28 DIAGNOSIS — M50221 Other cervical disc displacement at C4-C5 level: Secondary | ICD-10-CM | POA: Diagnosis not present

## 2015-12-30 ENCOUNTER — Ambulatory Visit (INDEPENDENT_AMBULATORY_CARE_PROVIDER_SITE_OTHER): Payer: BLUE CROSS/BLUE SHIELD | Admitting: Sports Medicine

## 2015-12-30 DIAGNOSIS — M5412 Radiculopathy, cervical region: Secondary | ICD-10-CM

## 2015-12-30 DIAGNOSIS — R6882 Decreased libido: Secondary | ICD-10-CM

## 2015-12-30 MED ORDER — SILDENAFIL CITRATE 20 MG PO TABS: 20.0000 mg | ORAL_TABLET | ORAL | 11 refills | Status: DC | PRN

## 2015-12-30 MED ORDER — DICLOFENAC SODIUM 75 MG PO TBEC: 75.0000 mg | DELAYED_RELEASE_TABLET | Freq: Two times a day (BID) | ORAL | 3 refills | Status: AC

## 2015-12-30 NOTE — Assessment & Plan Note (Addendum)
Nothing surprising on the cervical spine MRI, there are fairly large disc extrusion's at the C5-C6 and C6-C7 level. Pain and paresthesias are intermittent, at this point we will hold off on epidural however he can just call me and I'm happy to order the epidural, which would take place at the left C6-C7 level and interlaminar. Return to see me on an as-needed basis for this. Switching to Voltaren.

## 2015-12-30 NOTE — Progress Notes (Signed)
  Subjective:    CC: Follow-up  HPI: This is a pleasant 45 year old male, he has left-sided cervical radiculopathy, MRI was done that shows a couple of disc protrusions. Overall he feels pretty good, he's not really using his NSAIDs right now. Symptoms are mild, persistent.  Past medical history, Surgical history, Family history not pertinant except as noted below, Social history, Allergies, and medications have been entered into the medical record, reviewed, and no changes needed.   Review of Systems: No fevers, chills, night sweats, weight loss, chest pain, or shortness of breath.   Objective:    General: Well Developed, well nourished, and in no acute distress.  Neuro: Alert and oriented x3, extra-ocular muscles intact, sensation grossly intact.  HEENT: Normocephalic, atraumatic, pupils equal round reactive to light, neck supple, no masses, no lymphadenopathy, thyroid nonpalpable.  Skin: Warm and dry, no rashes. Cardiac: Regular rate and rhythm, no murmurs rubs or gallops, no lower extremity edema.  Respiratory: Clear to auscultation bilaterally. Not using accessory muscles, speaking in full sentences.  Cervical spine MRI reviewed and shows several protruding discs with mild to moderate central canal stenosis, worst at the C5-C6 and C6-C7 levels.  Impression and Recommendations:    Left cervical radiculopathy Nothing surprising on the cervical spine MRI, there are fairly large disc extrusion's at the C5-C6 and C6-C7 level. Pain and paresthesias are intermittent, at this point we will hold off on epidural however he can just call me and I'm happy to order the epidural, which would take place at the left C6-C7 level and interlaminar. Return to see me on an as-needed basis for this. Switching to Voltaren.  I spent 25 minutes with this patient, greater than 50% was face-to-face time counseling regarding the above diagnoses

## 2016-03-13 ENCOUNTER — Encounter: Payer: Self-pay | Admitting: Sports Medicine

## 2016-03-13 ENCOUNTER — Ambulatory Visit (INDEPENDENT_AMBULATORY_CARE_PROVIDER_SITE_OTHER): Payer: BLUE CROSS/BLUE SHIELD | Admitting: Sports Medicine

## 2016-03-13 DIAGNOSIS — M5412 Radiculopathy, cervical region: Secondary | ICD-10-CM

## 2016-03-13 MED ORDER — GABAPENTIN 100 MG PO CAPS: 100.0000 mg | ORAL_CAPSULE | Freq: Two times a day (BID) | ORAL | 11 refills | Status: DC

## 2016-03-13 NOTE — Assessment & Plan Note (Signed)
Initially improved but now with the persistence of left periscapular radicular symptoms. Declines epidural. Adding low-dose gabapentin.

## 2016-03-13 NOTE — Progress Notes (Signed)
  Subjective:    CC: Follow-up  HPI: Left cervical radiculopathy: Show improved but is having a recurrence of persistent left periscapular radicular pain. He is against most invasive measures and declines epidurals. Pain is moderate, persistent. He is agreeable to try gabapentin for use as needed.  Past medical history:  Negative.  See flowsheet/record as well for more information.  Surgical history: Negative.  See flowsheet/record as well for more information.  Family history: Negative.  See flowsheet/record as well for more information.  Social history: Negative.  See flowsheet/record as well for more information.  Allergies, and medications have been entered into the medical record, reviewed, and no changes needed.   Review of Systems: No fevers, chills, night sweats, weight loss, chest pain, or shortness of breath.   Objective:    General: Well Developed, well nourished, and in no acute distress.  Neuro: Alert and oriented x3, extra-ocular muscles intact, sensation grossly intact.  HEENT: Normocephalic, atraumatic, pupils equal round reactive to light, neck supple, no masses, no lymphadenopathy, thyroid nonpalpable.  Skin: Warm and dry, no rashes. Cardiac: Regular rate and rhythm, no murmurs rubs or gallops, no lower extremity edema.  Respiratory: Clear to auscultation bilaterally. Not using accessory muscles, speaking in full sentences.  Impression and Recommendations:    Left cervical radiculopathy Initially improved but now with the persistence of left periscapular radicular symptoms. Declines epidural. Adding low-dose gabapentin.  I spent 25 minutes with this patient, greater than 50% was face-to-face time counseling regarding the above diagnoses

## 2016-04-07 ENCOUNTER — Encounter: Payer: Self-pay | Admitting: Sports Medicine

## 2016-04-07 ENCOUNTER — Ambulatory Visit (INDEPENDENT_AMBULATORY_CARE_PROVIDER_SITE_OTHER): Payer: BLUE CROSS/BLUE SHIELD | Admitting: Sports Medicine

## 2016-04-07 DIAGNOSIS — H6123 Impacted cerumen, bilateral: Secondary | ICD-10-CM | POA: Diagnosis not present

## 2016-04-07 DIAGNOSIS — J209 Acute bronchitis, unspecified: Secondary | ICD-10-CM | POA: Diagnosis not present

## 2016-04-07 MED ORDER — DOXYCYCLINE HYCLATE 100 MG PO TABS: 100.0000 mg | ORAL_TABLET | Freq: Two times a day (BID) | ORAL | 0 refills | Status: AC

## 2016-04-07 MED ORDER — HYDROCOD POLST-CPM POLST ER 10-8 MG/5ML PO SUER: 5.0000 mL | Freq: Two times a day (BID) | ORAL | 0 refills | Status: DC | PRN

## 2016-04-07 NOTE — Progress Notes (Signed)
  Subjective:    CC: Coughing  HPI: This is a pleasant 45 year old male, he comes in with a 3 week history of persistent cough, productive of brownish sputum. No shortness of breath, minimal left-sided chest pain. Worse with coughing, nothing overtly pleuritic. No leg swelling. No GI symptoms, no skin rash. 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  Past medical history:  Negative.  See flowsheet/record as well for more information.  Surgical history: Negative.  See flowsheet/record as well for more information.  Family history: Negative.  See flowsheet/record as well for more information.  Social history: Negative.  See flowsheet/record as well for more information.  Allergies, and medications have been entered into the medical record, reviewed, and no changes needed.   Review of Systems: No fevers, chills, night sweats, weight loss, chest pain, or shortness of breath.   Objective:    General: Well Developed, well nourished, and in no acute distress.  Neuro: Alert and oriented x3, extra-ocular muscles intact, sensation grossly intact.  HEENT: Normocephalic, atraumatic, pupils equal round reactive to light, neck supple, no masses, no lymphadenopathy, thyroid nonpalpable. Oropharynx, nasopharynx, ear canals unremarkable. Skin: Warm and dry, no rashes. Cardiac: Regular rate and rhythm, no murmurs rubs or gallops, no lower extremity edema.  Respiratory: Clear to auscultation bilaterally. Not using accessory muscles, speaking in full sentences.  Indication: Cerumen impaction of the left and right ear(s) Medical necessity statement: On physical examination, cerumen impairs clinically significant portions of the external auditory canal, and tympanic membrane. Noted obstructive, copious cerumen that cannot be removed without magnification and instrumentations requiring physician skills Consent: Discussed benefits and risks of procedure and verbal consent  obtained Procedure: Patient was prepped for the procedure. Utilized an otoscope to assess and take note of the ear canal, the tympanic membrane, and the presence, amount, and placement of the cerumen. Gentle water irrigation was utilized to remove cerumen.  Post procedure examination: shows cerumen was completely removed. Patient tolerated procedure well. The patient is made aware that they may experience temporary vertigo, temporary hearing loss, and temporary discomfort. If these symptom last for more than 24 hours to call the clinic or proceed to the ED.  Impression and Recommendations:    Subacute bronchitis Considering duration of symptoms and allergy to macrolides we are going to use doxycycline. I do one a chest x-ray and I'm also going to add Tussionex.

## 2016-04-07 NOTE — Assessment & Plan Note (Signed)
Considering duration of symptoms and allergy to macrolides we are going to use doxycycline. I do one a chest x-ray and I'm also going to add Tussionex.

## 2016-04-07 NOTE — Patient Instructions (Signed)

## 2016-05-29 ENCOUNTER — Ambulatory Visit (INDEPENDENT_AMBULATORY_CARE_PROVIDER_SITE_OTHER): Payer: BLUE CROSS/BLUE SHIELD | Admitting: Sports Medicine

## 2016-05-29 ENCOUNTER — Encounter: Payer: Self-pay | Admitting: Sports Medicine

## 2016-05-29 DIAGNOSIS — R69 Illness, unspecified: Secondary | ICD-10-CM

## 2016-05-29 DIAGNOSIS — J111 Influenza due to unidentified influenza virus with other respiratory manifestations: Secondary | ICD-10-CM | POA: Insufficient documentation

## 2016-05-29 NOTE — Assessment & Plan Note (Signed)
Out of the window for Tamiflu, he will asymptomatic care and hydration and rest. I am going to treat his fiance and stepson with Tamiflu for exposure.

## 2016-05-29 NOTE — Progress Notes (Signed)
  Subjective:    CC: Feeling sick  HPI: For the past 4 days this pleasant 46 year old male has had fevers, chills, cough, mild body aches, sore throat. He has been taking over-the-counter cold and flu medications and feels much better, his 47 year old stepson and fiance live in the house with him and do not have symptoms. Symptoms are mild, improving.  Past medical history:  Negative.  See flowsheet/record as well for more information.  Surgical history: Negative.  See flowsheet/record as well for more information.  Family history: Negative.  See flowsheet/record as well for more information.  Social history: Negative.  See flowsheet/record as well for more information.  Allergies, and medications have been entered into the medical record, reviewed, and no changes needed.   Review of Systems: No fevers, chills, night sweats, weight loss, chest pain, or shortness of breath.   Objective:    General: Well Developed, well nourished, and in no acute distress.  Neuro: Alert and oriented x3, extra-ocular muscles intact, sensation grossly intact.  HEENT: Normocephalic, atraumatic, pupils equal round reactive to light, neck supple, no masses, no lymphadenopathy, thyroid nonpalpable. Oropharynx, nasopharynx, ear canals unremarkable. Skin: Warm and dry, no rashes. Cardiac: Regular rate and rhythm, no murmurs rubs or gallops, no lower extremity edema.  Respiratory: Clear to auscultation bilaterally. Not using accessory muscles, speaking in full sentences.  Impression and Recommendations:    Influenza-like illness Out of the window for Tamiflu, he will asymptomatic care and hydration and rest. I am going to treat his fiance and stepson with Tamiflu for exposure.

## 2017-01-19 ENCOUNTER — Other Ambulatory Visit: Payer: Self-pay | Admitting: Physician Assistant

## 2017-01-19 DIAGNOSIS — R6882 Decreased libido: Secondary | ICD-10-CM

## 2017-03-05 ENCOUNTER — Other Ambulatory Visit: Payer: Self-pay | Admitting: Physician Assistant

## 2017-03-05 DIAGNOSIS — R6882 Decreased libido: Secondary | ICD-10-CM

## 2017-05-18 ENCOUNTER — Other Ambulatory Visit: Payer: Self-pay | Admitting: Physician Assistant

## 2017-05-18 DIAGNOSIS — R6882 Decreased libido: Secondary | ICD-10-CM

## 2017-08-08 IMAGING — MR MR CERVICAL SPINE W/O CM
5 series · 32 of 48 positions shown · non-contrast
Comparison: None.

CLINICAL DATA: Neck, left shoulder and left arm pain for 6 months.
Left hand numbness. Symptoms for 6 months. No known injury. Initial
encounter.

EXAM:
MRI CERVICAL SPINE WITHOUT CONTRAST
TECHNIQUE: Multiplanar, multisequence MR imaging of the cervical spine was
performed. No intravenous contrast was administered.

[Series 2: T2 · sagittal · 3.0mm · 0.56mm/px · 6 of 15 slices shown (1 of 2)]
[im 1/15]
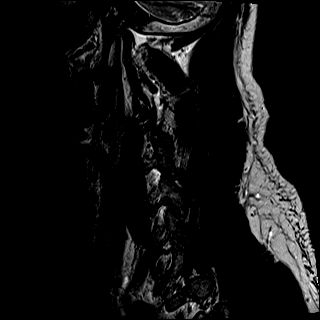
[im 3/15]
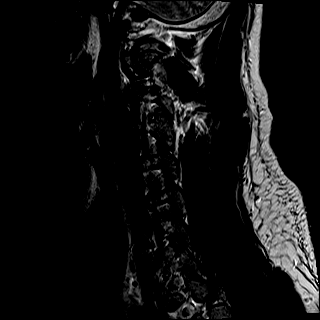
[im 6/15]
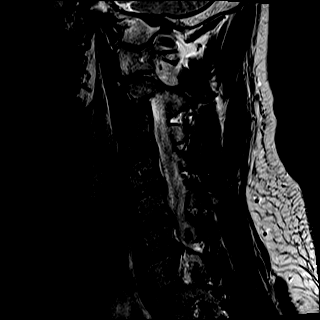
[im 9/15]
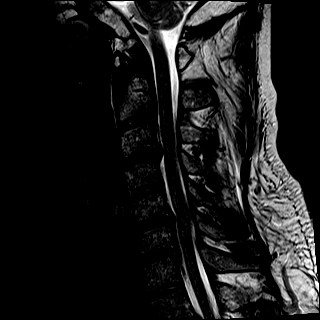
[im 12/15]
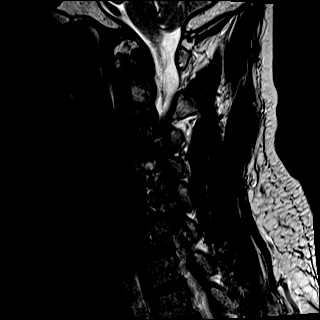
[im 15/15]
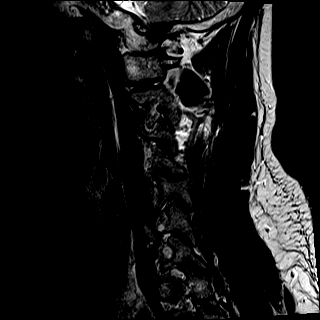

[Series 3: T1 · sagittal · 3.0mm · 0.70mm/px · 7 of 15 slices shown]
[im 1/15]
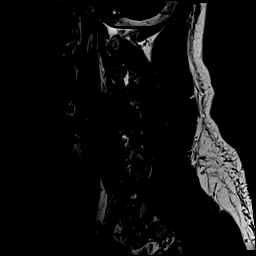
[im 3/15]
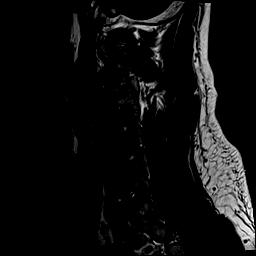
[im 5/15]
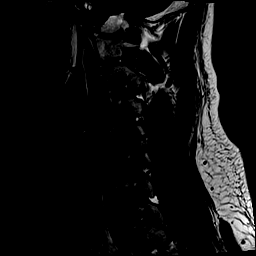
[im 8/15]
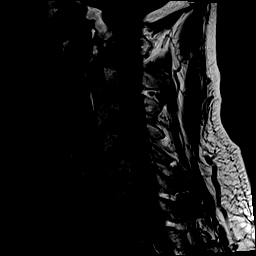
[im 10/15]
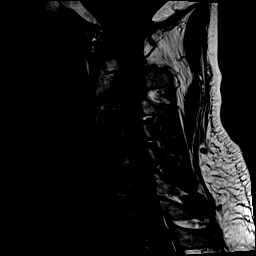
[im 12/15]
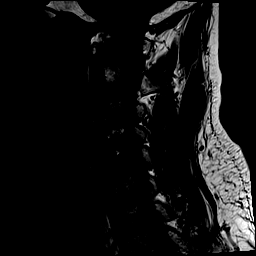
[im 15/15]
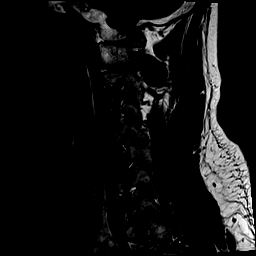

[Series 4: STIR · sagittal · 3.0mm · 0.35mm/px · 7 of 15 slices shown]
[im 1/15]
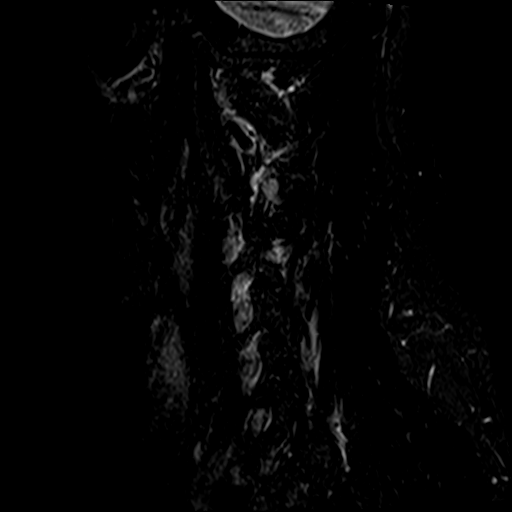
[im 3/15]
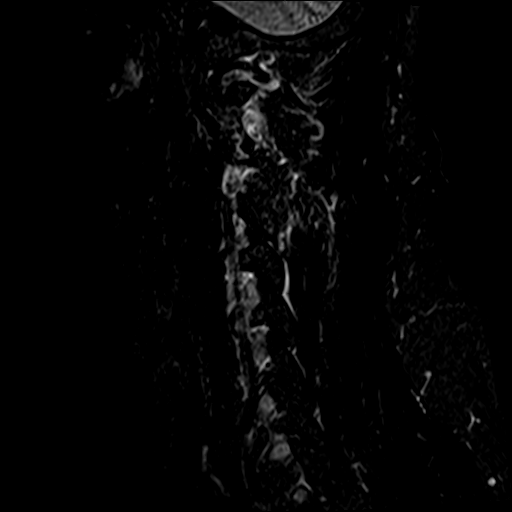
[im 5/15]
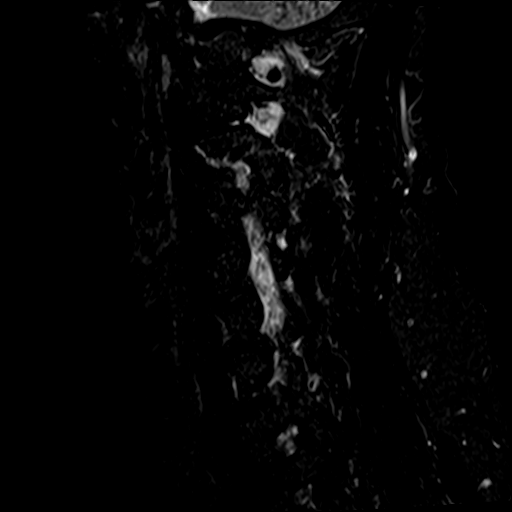
[im 8/15]
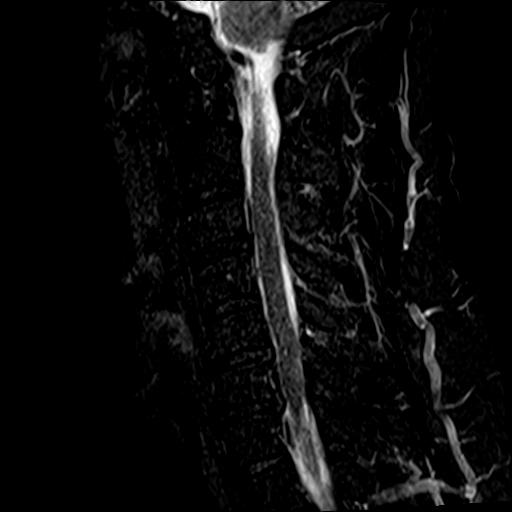
[im 10/15]
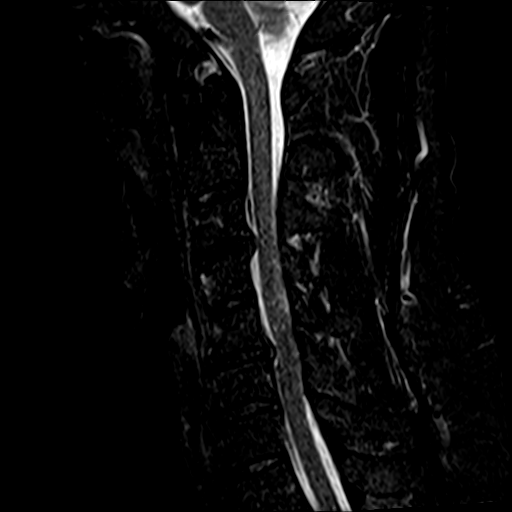
[im 12/15]
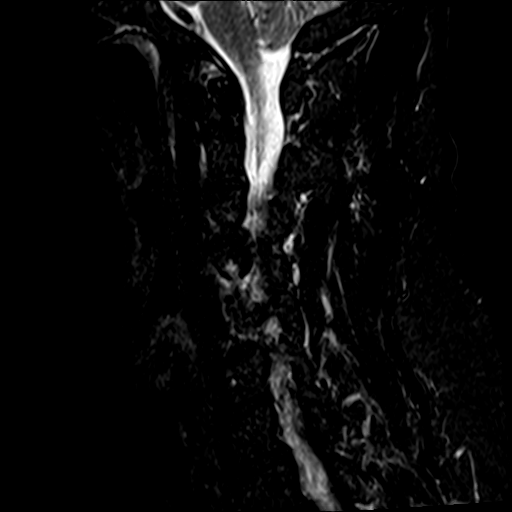
[im 15/15]
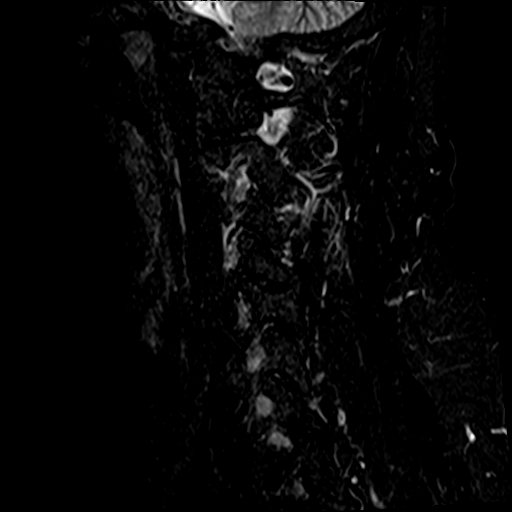

[Series 5: T2 · axial · 3.0mm · 0.62mm/px · z∈[-85,+25]mm · 8 of 30 slices shown (2 of 2)]
[im 1/30]
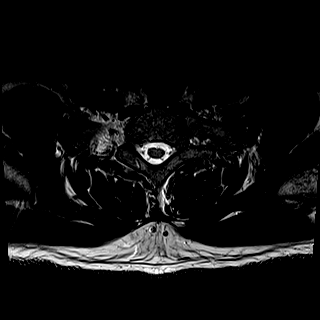
[im 5/30]
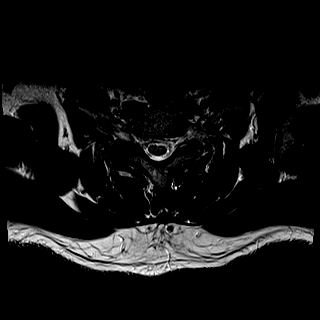
[im 9/30]
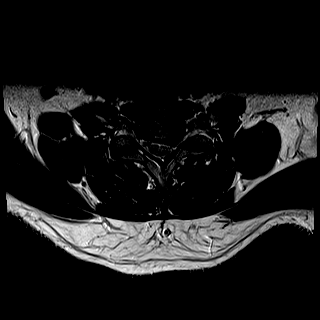
[im 14/30]
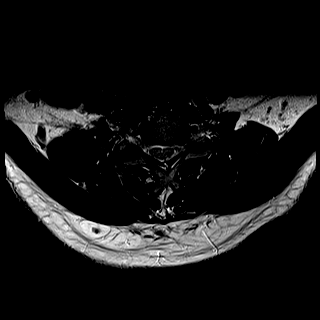
[im 16/30]
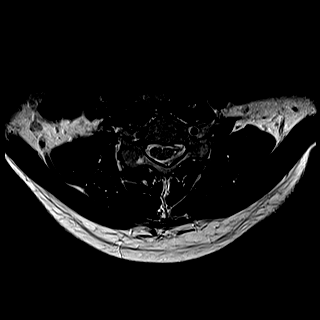
[im 21/30]
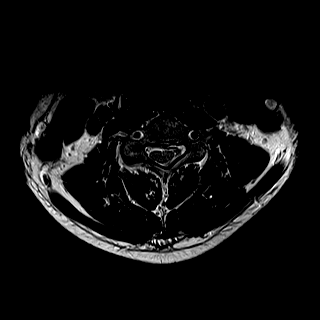
[im 25/30]
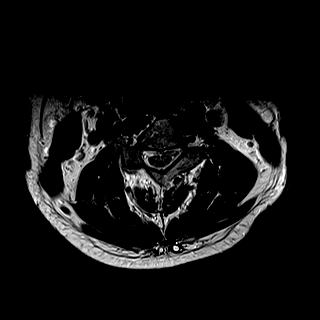
[im 30/30]
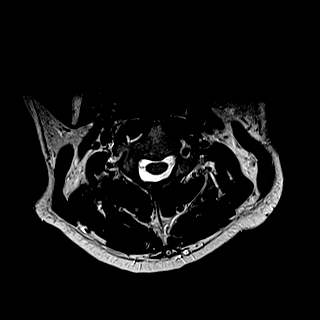

[Series 6: mpgr ax · axial · 3.0mm · 0.39mm/px · z∈[-79,-30]mm · 4 of 30 slices shown]
[im 1/30]
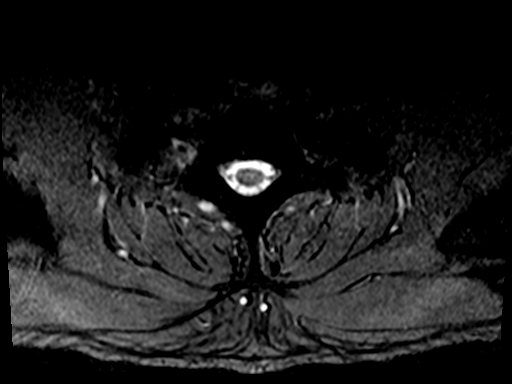
[im 5/30]
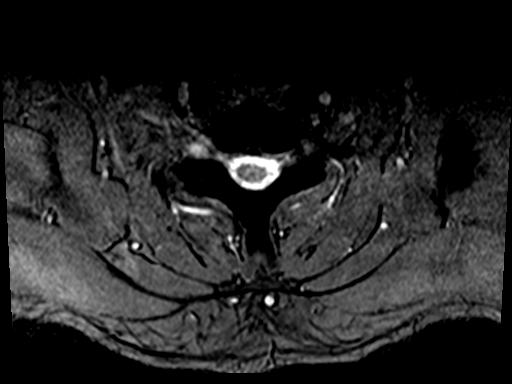
[im 9/30]
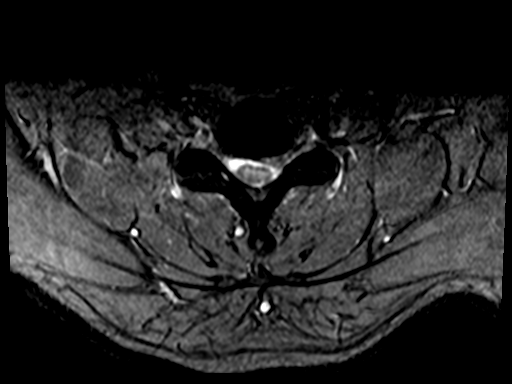
[im 14/30]
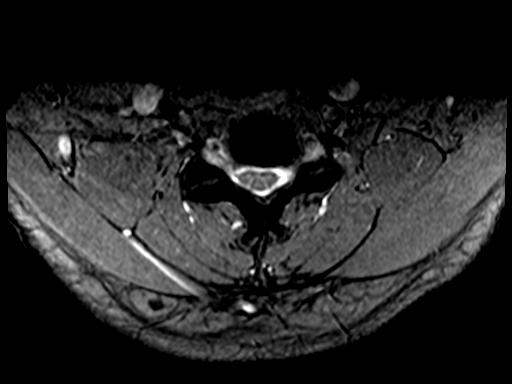

[32 of 48 positions shown; findings below may reference images not displayed]

FINDINGS: Alignment: Maintained.  Straightening of lordosis noted.

Vertebrae: No fracture.  Marrow signal is unremarkable.

Cord: Normal signal throughout.

Posterior Fossa, vertebral arteries, paraspinal tissues:
Unremarkable.

Disc levels:

C2-3: Mild uncovertebral spurring on the right. The central canal
and foramina are widely patent.

C3-4: Uncovertebral disease on the right is identified and there is
also a shallow disc bulge to the right. The ventral cord is slightly
deformed, more so to the right. Mild to moderate foraminal narrowing
is more notable on the right.

C4-5: Shallow left paracentral protrusion effaces the ventral thecal
sac. The foramina are open.

C5-6: Disc bulge and endplate spur are seen. A right side disc
protrusion at the foraminal entry zone is identified. Uncovertebral
disease appears worse on the left. The ventral cord is flattened.
The patient's disc protrusion could impact the right C6 root as it
enters the foramen. Moderately severe to severe left foraminal
narrowing is identified.

C6-7: A large left paracentral disc protrusion contacts and deforms
the left hemicord and impinges on the left C7 root as it enters the
foramen. Uncovertebral disease causes moderately severe left
foraminal narrowing.

C7-T1:  Negative.
IMPRESSION: Large left paracentral protrusion at C6-7 deforms left hemicord and
impinges on the right C7 root as it enters the foramen. Moderately
severe left foraminal stenosis is present at this level.

Flattening of the ventral cord at C5-6 where a right side disc
protrusion likely impinges on the right C6 root as it enters the
foramen and uncovertebral disease causes moderately severe to severe
left foraminal narrowing.

Shallow left paracentral protrusion C4-5 effaces the ventral thecal
sac. The foramina are open.

Disc bulge to the right and right worse than left uncovertebral
disease C3-4 cause mild to moderate right foraminal narrowing and
mild deformity of the ventral cord.

## 2017-08-21 ENCOUNTER — Ambulatory Visit (INDEPENDENT_AMBULATORY_CARE_PROVIDER_SITE_OTHER): Payer: Self-pay

## 2017-08-21 ENCOUNTER — Ambulatory Visit (INDEPENDENT_AMBULATORY_CARE_PROVIDER_SITE_OTHER): Payer: Self-pay | Admitting: Sports Medicine

## 2017-08-21 ENCOUNTER — Encounter: Payer: Self-pay | Admitting: Sports Medicine

## 2017-08-21 DIAGNOSIS — M19071 Primary osteoarthritis, right ankle and foot: Secondary | ICD-10-CM

## 2017-08-21 DIAGNOSIS — Z1810 Retained metal fragments, unspecified: Secondary | ICD-10-CM

## 2017-08-21 DIAGNOSIS — I10 Essential (primary) hypertension: Secondary | ICD-10-CM

## 2017-08-21 DIAGNOSIS — R6882 Decreased libido: Secondary | ICD-10-CM

## 2017-08-21 MED ORDER — IBUPROFEN 800 MG PO TABS: 800.0000 mg | ORAL_TABLET | Freq: Three times a day (TID) | ORAL | 2 refills | Status: DC | PRN

## 2017-08-21 MED ORDER — SILDENAFIL CITRATE 20 MG PO TABS: 20.0000 mg | ORAL_TABLET | ORAL | 11 refills | Status: DC | PRN

## 2017-08-21 MED ORDER — COSAMIN ASU ADVANCED FORMULA PO CAPS: 2.0000 | ORAL_CAPSULE | Freq: Two times a day (BID) | ORAL | 11 refills | Status: DC

## 2017-08-21 NOTE — Assessment & Plan Note (Signed)
Continues to do well with sildenafil, sending in refills.

## 2017-08-21 NOTE — Assessment & Plan Note (Signed)
Baseline x-rays, ibuprofen 800, icing. Rigid soled shoes. May try glucosamine/chondroitin. Return in 4 weeks, injection if no better.

## 2017-08-21 NOTE — Assessment & Plan Note (Signed)
Overall controlled, checking routine labs. 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

## 2017-08-21 NOTE — Progress Notes (Signed)
Subjective:    CC: Right foot pain  HPI: Annette Stable returns, he is a pleasant 47 year old male, for many years now he said pain at the right first MTP, moderate, persistent, localized without radiation.  Use some of his wife's ibuprofen 800 with good results.  Erectile dysfunction: Needs a refill on sildenafil.  Preventive measures: Declines vaccinations, due for routine labs.  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   I reviewed the past medical history, family history, social history, surgical history, and allergies today and no changes were needed.  Please see the problem list section below in epic for further details.  Past Medical History: No past medical history on file. Past Surgical History: No past surgical history on file. Social History: Social History   Socioeconomic History  . Marital status: Single    Spouse name: Not on file  . Number of children: Not on file  . Years of education: Not on file  . Highest education level: Not on file  Occupational History  . Not on file  Social Needs  . Financial resource strain: Not on file  . Food insecurity:    Worry: Not on file    Inability: Not on file  . Transportation needs:    Medical: Not on file    Non-medical: Not on file  Tobacco Use  . Smoking status: Never Smoker  . Smokeless tobacco: Never Used  Substance and Sexual Activity  . Alcohol use: Yes  . Drug use: Not on file  . Sexual activity: Not on file  Lifestyle  . Physical activity:    Days per week: Not on file    Minutes per session: Not on file  . Stress: Not on file  Relationships  . Social connections:    Talks on phone: Not on file    Gets together: Not on file    Attends religious service: Not on file    Active member of club or organization: Not on file    Attends meetings of clubs or organizations:  Not on file    Relationship status: Not on file  Other Topics Concern  . Not on file  Social History Narrative  . Not on file   Family History: Family History  Problem Relation Age of Onset  . Heart disease Father   . Hypertension Father    Allergies: Allergies  Allergen Reactions  . Bee Pollen   . Biaxin [Clarithromycin]    Medications: See med rec.  Review of Systems: No fevers, chills, night sweats, weight loss, chest pain, or shortness of breath.   Objective:    General: Well Developed, well nourished, and in no acute distress.  Neuro: Alert and oriented x3, extra-ocular muscles intact, sensation grossly intact.  HEENT: Normocephalic, atraumatic, pupils equal round reactive to light, neck supple, no masses, no lymphadenopathy, thyroid nonpalpable.  Skin: Warm and dry, no rashes. Cardiac: Regular rate and rhythm, no murmurs rubs or gallops, no lower extremity edema.  Respiratory: Clear to auscultation bilaterally. Not using accessory muscles, speaking in full sentences.  Impression and Recommendations:    Osteoarthritis of first metatarsophalangeal (MTP) joint of right foot Baseline x-rays, ibuprofen 800, icing. Rigid soled shoes. May try glucosamine/chondroitin. Return in 4 weeks, injection if no better.  Essential hypertension, benign Overall controlled, checking routine labs. 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  Decreased sex drive Continues to do well with sildenafil, sending in refills.  ___________________________________________ Ihor Austin. [name], M.D., ABFM., CAQSM. Primary Care and Sports Medicine [facility]  Adjunct Instructor of Family Medicine  [employer]

## 2017-09-18 ENCOUNTER — Ambulatory Visit: Payer: Self-pay | Admitting: Sports Medicine

## 2018-02-19 ENCOUNTER — Ambulatory Visit (INDEPENDENT_AMBULATORY_CARE_PROVIDER_SITE_OTHER): Payer: Self-pay | Admitting: Sports Medicine

## 2018-02-19 ENCOUNTER — Encounter: Payer: Self-pay | Admitting: Sports Medicine

## 2018-02-19 DIAGNOSIS — I10 Essential (primary) hypertension: Secondary | ICD-10-CM

## 2018-02-19 NOTE — Progress Notes (Signed)
  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  No headaches, visual changes, chest pain.  I reviewed the past medical history, family history, social history, surgical history, and allergies today and no changes were needed.  Please see the problem list section below in epic for further details.  Past Medical History: No past medical history on file. Past Surgical History: No past surgical history on file. Social History: Social History   Socioeconomic History  . Marital status: Single    Spouse name: Not on file  . Number of children: Not on file  . Years of education: Not on file  . Highest education level: Not on file  Occupational History  . Not on file  Social Needs  . Financial resource strain: Not on file  . Food insecurity:    Worry: Not on file    Inability: Not on file  . Transportation needs:    Medical: Not on file    Non-medical: Not on file  Tobacco Use  . Smoking status: Never Smoker  . Smokeless tobacco: Never Used  Substance and Sexual Activity  . Alcohol use: Yes  . Drug use: Not on file  . Sexual activity: Not on file  Lifestyle  . Physical activity:    Days per week: Not on file    Minutes per session: Not on file  . Stress: Not on file  Relationships  . Social connections:    Talks on phone: Not on file    Gets together: Not on file    Attends religious service: Not on file    Active member of club or organization: Not on file    Attends meetings of clubs or organizations: Not on file    Relationship status: Not on file  Other Topics Concern  . Not on file  Social History Narrative  . Not on file   Family History: Family History  Problem Relation Age of Onset  . Heart disease Father   . Hypertension Father    Allergies: Allergies  Allergen Reactions  . Bee Pollen   . Biaxin [Clarithromycin]    Medications: See med  rec.  Review of Systems: No fevers, chills, night sweats, weight loss, chest pain, or shortness of breath.   Objective:    General: Well Developed, well nourished, and in no acute distress.  Neuro: Alert and oriented x3, extra-ocular muscles intact, sensation grossly intact.  HEENT: Normocephalic, atraumatic, pupils equal round reactive to light, neck supple, no masses, no lymphadenopathy, thyroid nonpalpable.  Skin: Warm and dry, no rashes. Cardiac: Regular rate and rhythm, no murmurs rubs or gallops, no lower extremity edema.  Respiratory: Clear to auscultation bilaterally. Not using accessory muscles, speaking in full sentences.  Impression and Recommendations:    Essential hypertension, benign Diet-controlled hypertension. Return in 2 weeks for an additional blood pressure check. Needs to get his labs done today ___________________________________________ Ihor Austin. [name], M.D., ABFM., CAQSM. Primary Care and Sports Medicine [facility]  Adjunct Professor of Family Medicine  [employer]

## 2018-02-19 NOTE — Assessment & Plan Note (Signed)
Diet-controlled hypertension. Return in 2 weeks for an additional blood pressure check. Needs to get his labs done today

## 2018-03-05 ENCOUNTER — Ambulatory Visit (INDEPENDENT_AMBULATORY_CARE_PROVIDER_SITE_OTHER): Payer: Self-pay | Admitting: Sports Medicine

## 2018-03-05 ENCOUNTER — Encounter: Payer: Self-pay | Admitting: Sports Medicine

## 2018-03-05 DIAGNOSIS — I10 Essential (primary) hypertension: Secondary | ICD-10-CM

## 2018-03-05 NOTE — Assessment & Plan Note (Signed)
Diet controlled, beautiful on recheck.

## 2018-03-05 NOTE — Progress Notes (Signed)
  Subjective:    CC: High blood pressure  HPI: Hypertension: Diet-controlled, well controlled on recheck.  I reviewed the past medical history, family history, social history, surgical history, and allergies today and no changes were needed.  Please see the problem list section below in epic for further details.  Past Medical History: No past medical history on file. Past Surgical History: No past surgical history on file. Social History: Social History   Socioeconomic History  . Marital status: Single    Spouse name: Not on file  . Number of children: Not on file  . Years of education: Not on file  . Highest education level: Not on file  Occupational History  . Not on file  Social Needs  . Financial resource strain: Not on file  . Food insecurity:    Worry: Not on file    Inability: Not on file  . Transportation needs:    Medical: Not on file    Non-medical: Not on file  Tobacco Use  . Smoking status: Never Smoker  . Smokeless tobacco: Never Used  Substance and Sexual Activity  . Alcohol use: Yes  . Drug use: Not on file  . Sexual activity: Not on file  Lifestyle  . Physical activity:    Days per week: Not on file    Minutes per session: Not on file  . Stress: Not on file  Relationships  . Social connections:    Talks on phone: Not on file    Gets together: Not on file    Attends religious service: Not on file    Active member of club or organization: Not on file    Attends meetings of clubs or organizations: Not on file    Relationship status: Not on file  Other Topics Concern  . Not on file  Social History Narrative  . Not on file   Family History: Family History  Problem Relation Age of Onset  . Heart disease Father   . Hypertension Father    Allergies: Allergies  Allergen Reactions  . Bee Pollen   . Biaxin [Clarithromycin]    Medications: See med rec.  Review of Systems: No fevers, chills, night sweats, weight loss, chest pain, or shortness  of breath.   Objective:    General: Well Developed, well nourished, and in no acute distress.  Neuro: Alert and oriented x3, extra-ocular muscles intact, sensation grossly intact.  HEENT: Normocephalic, atraumatic, pupils equal round reactive to light, neck supple, no masses, no lymphadenopathy, thyroid nonpalpable.  Skin: Warm and dry, no rashes. Cardiac: Regular rate and rhythm, no murmurs rubs or gallops, no lower extremity edema.  Respiratory: Clear to auscultation bilaterally. Not using accessory muscles, speaking in full sentences.  Impression and Recommendations:    Essential hypertension, benign Diet controlled, beautiful on recheck. ___________________________________________ Ihor Austin. [name], M.D., ABFM., CAQSM. Primary Care and Sports Medicine [facility]  Adjunct Professor of Family Medicine  [employer]

## 2018-03-13 ENCOUNTER — Encounter: Payer: Self-pay | Admitting: Sports Medicine

## 2018-03-13 DIAGNOSIS — E782 Mixed hyperlipidemia: Secondary | ICD-10-CM | POA: Insufficient documentation

## 2018-03-13 LAB — COMPREHENSIVE METABOLIC PANEL WITH GFR
ALT: 26 U/L (ref 9–46)
Albumin: 4.7 g/dL (ref 3.6–5.1)
Alkaline phosphatase (APISO): 79 U/L (ref 40–115)
BUN: 16 mg/dL (ref 7–25)
Creat: 1.01 mg/dL (ref 0.60–1.35)
Potassium: 4.4 mmol/L (ref 3.5–5.3)

## 2018-03-13 LAB — COMPREHENSIVE METABOLIC PANEL
AG Ratio: 1.8 (calc) (ref 1.0–2.5)
AST: 22 U/L (ref 10–40)
CO2: 28 mmol/L (ref 20–32)
Calcium: 9.9 mg/dL (ref 8.6–10.3)
Chloride: 101 mmol/L (ref 98–110)
Globulin: 2.6 g/dL (calc) (ref 1.9–3.7)
Glucose, Bld: 91 mg/dL (ref 65–99)
Sodium: 138 mmol/L (ref 135–146)
Total Bilirubin: 1.5 mg/dL — ABNORMAL HIGH (ref 0.2–1.2)
Total Protein: 7.3 g/dL (ref 6.1–8.1)

## 2018-03-13 LAB — LIPID PANEL W/REFLEX DIRECT LDL
Cholesterol: 240 mg/dL — ABNORMAL HIGH (ref <200)
HDL: 46 mg/dL (ref >40)
LDL Cholesterol (Calc): 149 mg/dL — ABNORMAL HIGH
Non-HDL Cholesterol (Calc): 194 mg/dL (calc) — ABNORMAL HIGH (ref <130)
Total CHOL/HDL Ratio: 5.2 (calc) — ABNORMAL HIGH (ref <5.0)
Triglycerides: 272 mg/dL — ABNORMAL HIGH (ref <150)

## 2018-03-13 LAB — CBC
HCT: 42.8 % (ref 38.5–50.0)
Hemoglobin: 14.8 g/dL (ref 13.2–17.1)
MCH: 28.8 pg (ref 27.0–33.0)
MCHC: 34.6 g/dL (ref 32.0–36.0)
MCV: 83.4 fL (ref 80.0–100.0)
MPV: 10.2 fL (ref 7.5–12.5)
Platelets: 292 Thousand/uL (ref 140–400)
RBC: 5.13 10*6/uL (ref 4.20–5.80)
RDW: 13 % (ref 11.0–15.0)
WBC: 6.4 Thousand/uL (ref 3.8–10.8)

## 2018-03-13 LAB — TSH: TSH: 1.3 mIU/L (ref 0.40–4.50)

## 2018-03-13 LAB — HEMOGLOBIN A1C
Hgb A1c MFr Bld: 5.4 % of total Hgb (ref <5.7)
Mean Plasma Glucose: 108 (calc)
eAG (mmol/L): 6 (calc)

## 2018-03-13 LAB — VITAMIN D 25 HYDROXY (VIT D DEFICIENCY, FRACTURES): Vit D, 25-Hydroxy: 31 ng/mL (ref 30–100)

## 2018-03-18 ENCOUNTER — Ambulatory Visit: Payer: Self-pay | Admitting: Sports Medicine

## 2018-03-25 ENCOUNTER — Ambulatory Visit (INDEPENDENT_AMBULATORY_CARE_PROVIDER_SITE_OTHER): Payer: Self-pay | Admitting: Sports Medicine

## 2018-03-25 ENCOUNTER — Encounter: Payer: Self-pay | Admitting: Sports Medicine

## 2018-03-25 DIAGNOSIS — I10 Essential (primary) hypertension: Secondary | ICD-10-CM

## 2018-03-25 DIAGNOSIS — E782 Mixed hyperlipidemia: Secondary | ICD-10-CM

## 2018-03-25 MED ORDER — ATORVASTATIN CALCIUM 10 MG PO TABS
10.0000 mg | ORAL_TABLET | Freq: Every day | ORAL | 3 refills | Status: DC
Start: 2018-03-25 — End: 2019-11-24

## 2018-03-25 NOTE — Assessment & Plan Note (Addendum)
Typically improves on recheck, but not today. He did have some energy drinks this morning. 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   If he is on blood pressure medicine he has to do it every year. He will return in a couple of weeks, drink no energy drinks beforehand.

## 2018-03-25 NOTE — Progress Notes (Signed)
Subjective:    CC: Follow-up  HPI: Hypertension: Blood pressure was persistently elevated.  No headaches, visual changes, chest pain.  Hyperlipidemia: Worsening over the past couple of years, agreeable to try cholesterol medication.  I reviewed the past medical history, family history, social history, surgical history, and allergies today and no changes were needed.  Please see the problem list section below in epic for further details.  Past Medical History: No past medical history on file. Past Surgical History: No past surgical history on file. Social History: Social History   Socioeconomic History  . Marital status: Single    Spouse name: Not on file  . Number of children: Not on file  . Years of education: Not on file  . Highest education level: Not on file  Occupational History  . Not on file  Social Needs  . Financial resource strain: Not on file  . Food insecurity:    Worry: Not on file    Inability: Not on file  . Transportation needs:    Medical: Not on file    Non-medical: Not on file  Tobacco Use  . Smoking status: Never Smoker  . Smokeless tobacco: Never Used  Substance and Sexual Activity  . Alcohol use: Yes  . Drug use: Not on file  . Sexual activity: Not on file  Lifestyle  . Physical activity:    Days per week: Not on file    Minutes per session: Not on file  . Stress: Not on file  Relationships  . Social connections:    Talks on phone: Not on file    Gets together: Not on file    Attends religious service: Not on file    Active member of club or organization: Not on file    Attends meetings of clubs or organizations: Not on file    Relationship status: Not on file  Other Topics Concern  . Not on file  Social History Narrative  . Not on file   Family History: Family History  Problem Relation Age of Onset  . Heart disease Father   . Hypertension Father    Allergies: Allergies  Allergen Reactions  . Bee Pollen   . Biaxin  [Clarithromycin]    Medications: See med rec.  Review of Systems: No fevers, chills, night sweats, weight loss, chest pain, or shortness of breath.   Objective:    General: Well Developed, well nourished, and in no acute distress.  Neuro: Alert and oriented x3, extra-ocular muscles intact, sensation grossly intact.  HEENT: Normocephalic, atraumatic, pupils equal round reactive to light, neck supple, no masses, no lymphadenopathy, thyroid nonpalpable.  Skin: Warm and dry, no rashes. Cardiac: Regular rate and rhythm, no murmurs rubs or gallops, no lower extremity edema.  Respiratory: Clear to auscultation bilaterally. Not using accessory muscles, speaking in full sentences.  Impression and Recommendations:    Hyperlipidemia, mixed At this point we are going to start atorvastatin 10 bedtime, recheck cholesterol fasting in 3 months.  Essential hypertension, benign Typically improves on recheck, but not today. He did have some energy drinks this morning. 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   If he is on blood pressure medicine he has to do it every year. He will return in a couple of weeks, drink no energy drinks beforehand. __________________________________________ Ihor Austinhomas J. Benjamin Stainhekkekandam, M.D., ABFM., CAQSM. Primary Care and Sports Medicine [facility]  Adjunct Professor of [facility] [employer] [employer]  [employer]

## 2018-03-25 NOTE — Assessment & Plan Note (Signed)
At this point we are going to start atorvastatin 10 bedtime, recheck cholesterol fasting in 3 months.

## 2018-04-10 ENCOUNTER — Ambulatory Visit (INDEPENDENT_AMBULATORY_CARE_PROVIDER_SITE_OTHER): Payer: Self-pay | Admitting: Sports Medicine

## 2018-04-10 ENCOUNTER — Encounter: Payer: Self-pay | Admitting: Sports Medicine

## 2018-04-10 DIAGNOSIS — R6882 Decreased libido: Secondary | ICD-10-CM

## 2018-04-10 DIAGNOSIS — E782 Mixed hyperlipidemia: Secondary | ICD-10-CM

## 2018-04-10 DIAGNOSIS — I10 Essential (primary) hypertension: Secondary | ICD-10-CM

## 2018-04-10 MED ORDER — LISINOPRIL 20 MG PO TABS: 20.0000 mg | ORAL_TABLET | Freq: Every day | ORAL | 3 refills | Status: DC

## 2018-04-10 MED ORDER — SILDENAFIL CITRATE 20 MG PO TABS: 20.0000 mg | ORAL_TABLET | ORAL | 11 refills | Status: DC | PRN

## 2018-04-10 NOTE — Progress Notes (Signed)
  Subjective:    CC: Elevated blood pressure  HPI: Benign essential hypertension: Continues to be elevated in spite of conservative measures, agreeable to proceed with blood pressure medication.  I reviewed the past medical history, family history, social history, surgical history, and allergies today and no changes were needed.  Please see the problem list section below in epic for further details.  Past Medical History: No past medical history on file. Past Surgical History: No past surgical history on file. Social History: Social History   Socioeconomic History  . Marital status: Single    Spouse name: Not on file  . Number of children: Not on file  . Years of education: Not on file  . Highest education level: Not on file  Occupational History  . Not on file  Social Needs  . Financial resource strain: Not on file  . Food insecurity:    Worry: Not on file    Inability: Not on file  . Transportation needs:    Medical: Not on file    Non-medical: Not on file  Tobacco Use  . Smoking status: Never Smoker  . Smokeless tobacco: Never Used  Substance and Sexual Activity  . Alcohol use: Yes  . Drug use: Not on file  . Sexual activity: Not on file  Lifestyle  . Physical activity:    Days per week: Not on file    Minutes per session: Not on file  . Stress: Not on file  Relationships  . Social connections:    Talks on phone: Not on file    Gets together: Not on file    Attends religious service: Not on file    Active member of club or organization: Not on file    Attends meetings of clubs or organizations: Not on file    Relationship status: Not on file  Other Topics Concern  . Not on file  Social History Narrative  . Not on file   Family History: Family History  Problem Relation Age of Onset  . Heart disease Father   . Hypertension Father    Allergies: Allergies  Allergen Reactions  . Bee Pollen   . Biaxin [Clarithromycin]    Medications: See med  rec.  Review of Systems: No fevers, chills, night sweats, weight loss, chest pain, or shortness of breath.   Objective:    General: Well Developed, well nourished, and in no acute distress.  Neuro: Alert and oriented x3, extra-ocular muscles intact, sensation grossly intact.  HEENT: Normocephalic, atraumatic, pupils equal round reactive to light, neck supple, no masses, no lymphadenopathy, thyroid nonpalpable.  Skin: Warm and dry, no rashes. Cardiac: Regular rate and rhythm, no murmurs rubs or gallops, no lower extremity edema.  Respiratory: Clear to auscultation bilaterally. Not using accessory muscles, speaking in full sentences.  Impression and Recommendations:    Essential hypertension, benign At this point we have tried low-sodium diet, decreased use of energy beverages, blood pressure continues to be elevated. Adding lisinopril 20 mg daily. Return in 2 weeks for a nurse visit blood pressure check. 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  ___________________________________________ Ihor Austinhomas J. Benjamin Stainhekkekandam, M.D., ABFM., CAQSM. Primary Care and Sports Medicine [facility]  Adjunct Professor of Family Medicine  [employer]

## 2018-04-10 NOTE — Assessment & Plan Note (Signed)
At this point we have tried low-sodium diet, decreased use of energy beverages, blood pressure continues to be elevated. Adding lisinopril 20 mg daily. Return in 2 weeks for a nurse visit blood pressure check. 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

## 2018-04-22 ENCOUNTER — Ambulatory Visit (INDEPENDENT_AMBULATORY_CARE_PROVIDER_SITE_OTHER): Payer: Self-pay | Admitting: Sports Medicine

## 2018-04-22 VITALS — BP 136/81 | HR 72 | Temp 98.1°F | Wt 254.0 lb

## 2018-04-22 DIAGNOSIS — I10 Essential (primary) hypertension: Secondary | ICD-10-CM

## 2018-04-22 NOTE — Progress Notes (Signed)
Patient in today for BP check. Patient reports he is taking lisinopril. BP in the office today is 136/81. Pt denies headaches, blurred vision, chest pain and shortness of breath. Pt is to continue with current medications, Provider will be notified . If any changes Pt will be called and advised.

## 2018-04-22 NOTE — Assessment & Plan Note (Signed)
Lisinopril 20 is effective, return as needed. Blood pressure well controlled.

## 2018-05-28 ENCOUNTER — Encounter: Payer: Self-pay | Admitting: Sports Medicine

## 2018-05-28 ENCOUNTER — Ambulatory Visit (INDEPENDENT_AMBULATORY_CARE_PROVIDER_SITE_OTHER): Payer: Self-pay | Admitting: Sports Medicine

## 2018-05-28 VITALS — BP 159/79 | HR 111 | Temp 101.0°F | Ht 70.0 in | Wt 245.0 lb

## 2018-05-28 DIAGNOSIS — R509 Fever, unspecified: Secondary | ICD-10-CM

## 2018-05-28 DIAGNOSIS — H6123 Impacted cerumen, bilateral: Secondary | ICD-10-CM

## 2018-05-28 DIAGNOSIS — R05 Cough: Secondary | ICD-10-CM

## 2018-05-28 DIAGNOSIS — H6120 Impacted cerumen, unspecified ear: Secondary | ICD-10-CM | POA: Insufficient documentation

## 2018-05-28 DIAGNOSIS — R52 Pain, unspecified: Secondary | ICD-10-CM

## 2018-05-28 DIAGNOSIS — J101 Influenza due to other identified influenza virus with other respiratory manifestations: Secondary | ICD-10-CM

## 2018-05-28 DIAGNOSIS — R059 Cough, unspecified: Secondary | ICD-10-CM

## 2018-05-28 LAB — POCT INFLUENZA A/B
Influenza A, POC: POSITIVE — AB
Influenza B, POC: NEGATIVE

## 2018-05-28 MED ORDER — BENZONATATE 200 MG PO CAPS: 200.0000 mg | ORAL_CAPSULE | Freq: Three times a day (TID) | ORAL | 0 refills | Status: DC | PRN

## 2018-05-28 MED ORDER — OSELTAMIVIR PHOSPHATE 75 MG PO CAPS: 75.0000 mg | ORAL_CAPSULE | Freq: Every day | ORAL | 0 refills | Status: DC

## 2018-05-28 MED ORDER — HYDROCOD POLST-CPM POLST ER 10-8 MG/5ML PO SUER: 5.0000 mL | Freq: Two times a day (BID) | ORAL | 0 refills | Status: DC | PRN

## 2018-05-28 NOTE — Patient Instructions (Signed)

## 2018-05-28 NOTE — Assessment & Plan Note (Signed)
Major symptoms started on Sunday. Positive for influenza A. Adding Tamiflu, Tussionex, Tessalon Perles. May use over-the-counter cold and flu medications for symptomatic pain relief. Return as needed.

## 2018-05-28 NOTE — Progress Notes (Signed)
Subjective:    CC: Feeling sick  HPI: Carlos Mathews is a pleasant 48 year old male.  Since Sunday he started to feel very sick, fevers, chills, muscle aches, body aches, sore throat, cough.  No skin rash, no nausea, vomiting, diarrhea, constipation.  Symptoms are severe, persistent.  No shortness of breath or chest pain.  I reviewed the past medical history, family history, social history, surgical history, and allergies today and no changes were needed.  Please see the problem list section below in epic for further details.  Past Medical History: No past medical history on file. Past Surgical History: No past surgical history on file. Social History: Social History   Socioeconomic History  . Marital status: Single    Spouse name: Not on file  . Number of children: Not on file  . Years of education: Not on file  . Highest education level: Not on file  Occupational History  . Not on file  Social Needs  . Financial resource strain: Not on file  . Food insecurity:    Worry: Not on file    Inability: Not on file  . Transportation needs:    Medical: Not on file    Non-medical: Not on file  Tobacco Use  . Smoking status: Never Smoker  . Smokeless tobacco: Never Used  Substance and Sexual Activity  . Alcohol use: Yes  . Drug use: Not on file  . Sexual activity: Not on file  Lifestyle  . Physical activity:    Days per week: Not on file    Minutes per session: Not on file  . Stress: Not on file  Relationships  . Social connections:    Talks on phone: Not on file    Gets together: Not on file    Attends religious service: Not on file    Active member of club or organization: Not on file    Attends meetings of clubs or organizations: Not on file    Relationship status: Not on file  Other Topics Concern  . Not on file  Social History Narrative  . Not on file   Family History: Family History  Problem Relation Age of Onset  . Heart disease Father   . Hypertension Father     Allergies: Allergies  Allergen Reactions  . Bee Pollen   . Biaxin [Clarithromycin]    Medications: See med rec.  Review of Systems: No fevers, chills, night sweats, weight loss, chest pain, or shortness of breath.   Objective:    General: Well Developed, well nourished, and in no acute distress.  Neuro: Alert and oriented x3, extra-ocular muscles intact, sensation grossly intact.  HEENT: Normocephalic, atraumatic, pupils equal round reactive to light, neck supple, no masses, no lymphadenopathy, thyroid nonpalpable.  Oropharynx, nasopharynx unremarkable, sinuses nontender to palpation.  Bilateral cerumen impactions. Skin: Warm and dry, no rashes. Cardiac: Regular rate and rhythm, no murmurs rubs or gallops, no lower extremity edema.  Respiratory: Clear to auscultation bilaterally. Not using accessory muscles, speaking in full sentences.  Indication: Cerumen impaction of the left and right ear(s) Medical necessity statement: On physical examination, cerumen impairs clinically significant portions of the external auditory canal, and tympanic membrane. Noted obstructive, copious cerumen that cannot be removed without magnification and instrumentations requiring physician skills Consent: Discussed benefits and risks of procedure and verbal consent obtained Procedure: Patient was prepped for the procedure. Utilized an otoscope to assess and take note of the ear canal, the tympanic membrane, and the presence, amount, and placement of the  cerumen. Gentle water irrigation was utilized to remove cerumen.  Post procedure examination: shows cerumen was completely removed. Patient tolerated procedure well. The patient is made aware that they may experience temporary vertigo, temporary hearing loss, and temporary discomfort. If these symptom last for more than 24 hours to call the clinic or proceed to the ED.  Influenza swab positive for influenza A.  Impression and Recommendations:    Influenza  A Major symptoms started on Sunday. Positive for influenza A. Adding Tamiflu, Tussionex, Tessalon Perles. May use over-the-counter cold and flu medications for symptomatic pain relief. Return as needed.  Cerumen impaction Worse than left, bilateral irrigation. ___________________________________________ Ihor Austinhomas J. Benjamin Stainhekkekandam, M.D., ABFM., CAQSM. Primary Care and Sports Medicine [facility]  Adjunct Professor of Family Medicine  [employer]

## 2018-05-28 NOTE — Assessment & Plan Note (Signed)
Worse than left, bilateral irrigation.

## 2018-06-29 ENCOUNTER — Other Ambulatory Visit: Payer: Self-pay | Admitting: Sports Medicine

## 2018-06-29 DIAGNOSIS — I10 Essential (primary) hypertension: Secondary | ICD-10-CM

## 2019-06-19 ENCOUNTER — Other Ambulatory Visit: Payer: Self-pay | Admitting: Sports Medicine

## 2019-06-19 DIAGNOSIS — R6882 Decreased libido: Secondary | ICD-10-CM

## 2019-11-24 ENCOUNTER — Encounter: Payer: Self-pay | Admitting: Sports Medicine

## 2019-11-24 ENCOUNTER — Other Ambulatory Visit: Payer: Self-pay

## 2019-11-24 ENCOUNTER — Ambulatory Visit (INDEPENDENT_AMBULATORY_CARE_PROVIDER_SITE_OTHER): Payer: Self-pay | Admitting: Sports Medicine

## 2019-11-24 VITALS — BP 142/84 | HR 65 | Ht 70.0 in | Wt 245.0 lb

## 2019-11-24 DIAGNOSIS — E782 Mixed hyperlipidemia: Secondary | ICD-10-CM

## 2019-11-24 DIAGNOSIS — Z20822 Contact with and (suspected) exposure to covid-19: Secondary | ICD-10-CM

## 2019-11-24 DIAGNOSIS — R5383 Other fatigue: Secondary | ICD-10-CM

## 2019-11-24 DIAGNOSIS — I10 Essential (primary) hypertension: Secondary | ICD-10-CM

## 2019-11-24 DIAGNOSIS — Z0000 Encounter for general adult medical examination without abnormal findings: Secondary | ICD-10-CM

## 2019-11-24 DIAGNOSIS — G479 Sleep disorder, unspecified: Secondary | ICD-10-CM

## 2019-11-24 MED ORDER — ATORVASTATIN CALCIUM 10 MG PO TABS: 10.0000 mg | ORAL_TABLET | Freq: Every day | ORAL | 3 refills | Status: DC

## 2019-11-24 MED ORDER — LISINOPRIL 20 MG PO TABS: 20.0000 mg | ORAL_TABLET | Freq: Every day | ORAL | 3 refills | Status: DC

## 2019-11-24 NOTE — Progress Notes (Signed)
    Procedures performed today:    None.  Independent interpretation of notes and tests performed by another provider:   None.  Brief History, Exam, Impression, and Recommendations:    [name] is a pleasant 49yo male who presents today with complaints of fatigue. 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 He endorses low sex drive and difficulty putting on muscle. He says his sleep is poor and he snores at night unless he is on his side. His fatigue is most likely largely due to his work hours, but we are going to run labs to rule out low testosterone, anemia, and hypothyroidism. We are also going torun COVID antibodies today since he might have had COVID in the past. He has no interest in getting the vaccine at this time.   Aurelio Jew, MS3   ___________________________________________ Ihor Austin. [name], M.D., ABFM., CAQSM. Primary Care and Sports Medicine [facility]  Adjunct Instructor of Family Medicine  [employer]

## 2019-11-24 NOTE — Assessment & Plan Note (Signed)
This is a very pleasant 49 year old male, he is an extremely Chief Executive Officer, he owns a tow truck company. He will often spend 40+ hours without sleep traveling across the country with his heavy left toe truck. Unfortunately he has been complaining of fairly severe fatigue. At this point he does not desire any sleep apnea work-up, likely related to his CDL. We are going to do a routine lab work-up including testosterone levels, he does have some mild depression, overall I think his fatigue is going to be related to his sleep disturbance.

## 2019-11-24 NOTE — Assessment & Plan Note (Signed)
Due for colon cancer screening, adding Cologuard. 

## 2019-11-25 ENCOUNTER — Other Ambulatory Visit: Payer: Self-pay | Admitting: Sports Medicine

## 2019-11-25 DIAGNOSIS — R6882 Decreased libido: Secondary | ICD-10-CM

## 2020-02-26 ENCOUNTER — Other Ambulatory Visit: Payer: Self-pay | Admitting: Sports Medicine

## 2020-02-26 DIAGNOSIS — R6882 Decreased libido: Secondary | ICD-10-CM

## 2020-06-02 ENCOUNTER — Other Ambulatory Visit: Payer: Self-pay | Admitting: Sports Medicine

## 2020-06-02 DIAGNOSIS — R6882 Decreased libido: Secondary | ICD-10-CM

## 2020-07-27 ENCOUNTER — Encounter: Payer: Self-pay | Admitting: Sports Medicine

## 2020-07-27 ENCOUNTER — Ambulatory Visit (INDEPENDENT_AMBULATORY_CARE_PROVIDER_SITE_OTHER): Payer: Self-pay | Admitting: Sports Medicine

## 2020-07-27 ENCOUNTER — Other Ambulatory Visit: Payer: Self-pay

## 2020-07-27 VITALS — BP 123/70 | HR 68 | Ht 70.0 in | Wt 243.0 lb

## 2020-07-27 DIAGNOSIS — Z0000 Encounter for general adult medical examination without abnormal findings: Secondary | ICD-10-CM

## 2020-07-27 DIAGNOSIS — G479 Sleep disorder, unspecified: Secondary | ICD-10-CM

## 2020-07-27 DIAGNOSIS — R6882 Decreased libido: Secondary | ICD-10-CM

## 2020-07-27 DIAGNOSIS — Z1211 Encounter for screening for malignant neoplasm of colon: Secondary | ICD-10-CM

## 2020-07-27 DIAGNOSIS — R5383 Other fatigue: Secondary | ICD-10-CM

## 2020-07-27 MED ORDER — SILDENAFIL CITRATE 20 MG PO TABS: 20.0000 mg | ORAL_TABLET | ORAL | 11 refills | Status: DC | PRN

## 2020-07-27 NOTE — Assessment & Plan Note (Signed)
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 Lots of fatigue. 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 He will simply try a strong couple coffee in the morning. In addition he notes dysgeusia and dysosmia. He is not sure if he is ever had Covid but we did have antibodies tested recently, awaiting results.

## 2020-07-27 NOTE — Assessment & Plan Note (Signed)
Adding Cologuard testing.

## 2020-07-27 NOTE — Progress Notes (Signed)
    Procedures performed today:    None.  Independent interpretation of notes and tests performed by another provider:   None.  Brief History, Exam, Impression, and Recommendations:    Decreased sex drive Carlos Mathews is doing well with sildenafil, refilling.  Annual physical exam Adding Cologuard testing.  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 Lots of fatigue. 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 He will simply try a strong couple coffee in the morning. In addition he notes dysgeusia and dysosmia. He is not sure if he is ever had Covid but we did have antibodies tested recently, awaiting results.    ___________________________________________ Ihor Austin. [name], M.D., ABFM., CAQSM. Primary Care and Sports Medicine [facility]  Adjunct Instructor of Family Medicine  [employer]

## 2020-07-27 NOTE — Assessment & Plan Note (Signed)
Carlos Mathews is doing well with sildenafil, refilling.

## 2020-07-30 LAB — COMPLETE METABOLIC PANEL WITH GFR
AG Ratio: 1.9 (calc) (ref 1.0–2.5)
ALT: 20 U/L (ref 9–46)
AST: 16 U/L (ref 10–35)
Albumin: 5 g/dL (ref 3.6–5.1)
Alkaline phosphatase (APISO): 62 U/L (ref 35–144)
BUN: 21 mg/dL (ref 7–25)
CO2: 29 mmol/L (ref 20–32)
Calcium: 10.2 mg/dL (ref 8.6–10.3)
Chloride: 103 mmol/L (ref 98–110)
Creat: 1 mg/dL (ref 0.70–1.33)
GFR, Est African American: 101 mL/min/{1.73_m2} (ref >=60)
GFR, Est Non African American: 87 mL/min/{1.73_m2} (ref >=60)
Globulin: 2.7 g/dL (calc) (ref 1.9–3.7)
Glucose, Bld: 97 mg/dL (ref 65–99)
Potassium: 4.6 mmol/L (ref 3.5–5.3)
Sodium: 139 mmol/L (ref 135–146)
Total Bilirubin: 1.1 mg/dL (ref 0.2–1.2)
Total Protein: 7.7 g/dL (ref 6.1–8.1)

## 2020-07-30 LAB — TESTOSTERONE, FREE & TOTAL
Free Testosterone: 52.6 pg/mL (ref 35.0–155.0)
Testosterone, Total, LC-MS-MS: 492 ng/dL (ref 250–1100)

## 2020-07-30 LAB — HEMOGLOBIN A1C
Hgb A1c MFr Bld: 5.3 % of total Hgb (ref <5.7)
Mean Plasma Glucose: 105 mg/dL
eAG (mmol/L): 5.8 mmol/L

## 2020-07-30 LAB — CBC
HCT: 47.7 % (ref 38.5–50.0)
Hemoglobin: 15.6 g/dL (ref 13.2–17.1)
MCH: 28 pg (ref 27.0–33.0)
MCHC: 32.7 g/dL (ref 32.0–36.0)
MCV: 85.5 fL (ref 80.0–100.0)
MPV: 10.4 fL (ref 7.5–12.5)
Platelets: 308 10*3/uL (ref 140–400)
RBC: 5.58 10*6/uL (ref 4.20–5.80)
RDW: 13 % (ref 11.0–15.0)
WBC: 5 10*3/uL (ref 3.8–10.8)

## 2020-07-30 LAB — LIPID PANEL W/REFLEX DIRECT LDL
Cholesterol: 232 mg/dL — ABNORMAL HIGH (ref <200)
HDL: 42 mg/dL (ref >=40)
LDL Cholesterol (Calc): 160 mg/dL (calc) — ABNORMAL HIGH
Non-HDL Cholesterol (Calc): 190 mg/dL (calc) — ABNORMAL HIGH (ref <130)
Total CHOL/HDL Ratio: 5.5 (calc) — ABNORMAL HIGH (ref <5.0)
Triglycerides: 158 mg/dL — ABNORMAL HIGH (ref <150)

## 2020-07-30 LAB — TSH: TSH: 1.25 mIU/L (ref 0.40–4.50)

## 2020-07-30 LAB — VITAMIN D 25 HYDROXY (VIT D DEFICIENCY, FRACTURES): Vit D, 25-Hydroxy: 31 ng/mL (ref 30–100)

## 2020-07-30 LAB — SARS COV-2 SEROLOGY(COVID-19)AB(IGG,IGM),IMMUNOASSAY
SARS CoV-2 AB IgG: POSITIVE — AB
SARS CoV-2 IgM: NEGATIVE

## 2020-11-29 ENCOUNTER — Emergency Department (INDEPENDENT_AMBULATORY_CARE_PROVIDER_SITE_OTHER)
Admission: EM | Admit: 2020-11-29 | Discharge: 2020-11-29 | Disposition: A | Discharge status: Home / Self Care | Payer: Self-pay | Source: Home / Self Care

## 2020-11-29 DIAGNOSIS — S01511A Laceration without foreign body of lip, initial encounter: Secondary | ICD-10-CM

## 2020-11-29 NOTE — ED Triage Notes (Signed)
Pt c/o lip laceration that occurred about 45 mins ago. Pulling out nail with hammer and hit self in top lip. Pain 2/10 Tdap not up to date. Refusing booster.

## 2020-11-29 NOTE — ED Provider Notes (Signed)
[facility]    CSN: [id number] Arrival date & time: 11/29/20  1830      History   Chief Complaint Chief Complaint  Patient presents with   Lip Laceration    HPI Carlos Mathews is a 50 y.o. male.   HPI 50 year old male presents with lip laceration that occurred 45 minutes ago.  Reports pulling out a nail with a hammer and hit himself and top lip.  Tdap is not up-to-date and patient is refusing booster.  History reviewed. No pertinent past medical history.  Patient Active Problem List   Diagnosis Date Noted   Hyperlipidemia, mixed 03/13/2018   Osteoarthritis of first metatarsophalangeal (MTP) joint of right foot 08/21/2017   Left cervical radiculopathy 11/12/2015   Lumbar degenerative disc disease 11/12/2015   Irritability 06/08/2015   Essential hypertension, benign 06/08/2015   Annual physical exam 06/16/2014   Fatigue due to sleep pattern disturbance 01/09/2013   Decreased sex drive 27/78/2423   Obesity 01/09/2013    History reviewed. No pertinent surgical history.     Home Medications    Prior to Admission medications   Medication Sig Start Date End Date Taking? Authorizing Provider  atorvastatin (LIPITOR) 10 MG tablet Take 1 tablet (10 mg total) by mouth daily at 6 PM. 11/24/19   Monica Becton, MD  lisinopril (ZESTRIL) 20 MG tablet Take 1 tablet (20 mg total) by mouth daily. 11/24/19   Monica Becton, MD  sildenafil (REVATIO) 20 MG tablet Take 1-5 tablets (20-100 mg total) by mouth as needed. MUST MAKE APPOINTMENT 07/27/20   Monica Becton, MD    Family History Family History  Problem Relation Age of Onset   Heart disease Father    Hypertension Father     Social History Social History   Tobacco Use   Smoking status: Never   Smokeless tobacco: Never  Substance Use Topics   Alcohol use: Yes     Allergies   Bee pollen and Biaxin [clarithromycin]   Review of Systems Review of Systems  Skin:  Positive for wound.        Laceration of central upper lip sustained 45 minutes ago    Physical Exam Triage Vital Signs ED Triage Vitals  Enc Vitals Group     BP 11/29/20 1900 137/84     Pulse Rate 11/29/20 1900 87     Resp 11/29/20 1900 17     Temp 11/29/20 1900 98.6 F (37 C)     Temp Source 11/29/20 1900 Oral     SpO2 11/29/20 1900 97 %     Weight --      Height --      Head Circumference --      Peak Flow --      Pain Score 11/29/20 1859 2     Pain Loc --      Pain Edu? --      Excl. in GC? --    No data found.  Updated Vital Signs BP 137/84 (BP Location: Right Arm)   Pulse 87   Temp 98.6 F (37 C) (Oral)   Resp 17   SpO2 97%     Physical Exam Vitals and nursing note reviewed.  Constitutional:      General: He is not in acute distress.    Appearance: Normal appearance. He is normal weight. He is not ill-appearing.  HENT:     Head: Normocephalic and atraumatic.     Mouth/Throat:     Mouth: Mucous membranes are  moist.     Pharynx: Oropharynx is clear.  Eyes:     Extraocular Movements: Extraocular movements intact.     Conjunctiva/sclera: Conjunctivae normal.     Pupils: Pupils are equal, round, and reactive to light.  Cardiovascular:     Rate and Rhythm: Normal rate and regular rhythm.     Pulses: Normal pulses.     Heart sounds: Normal heart sounds.  Pulmonary:     Effort: Pulmonary effort is normal.     Breath sounds: Normal breath sounds. No wheezing, rhonchi or rales.  Musculoskeletal:        General: Normal range of motion.     Cervical back: Normal range of motion and neck supple.  Skin:    Comments: Central upper lip: ~3.5 cm x 0.5 cm deep linear laceration noted  Neurological:     General: No focal deficit present.     Mental Status: He is alert and oriented to person, place, and time. Mental status is at baseline.  Psychiatric:        Mood and Affect: Mood normal.        Thought Content: Thought content normal.     UC Treatments / Results  Labs (all labs  ordered are listed, but only abnormal results are displayed) Labs Reviewed - No data to display  EKG   Radiology No results found.  Procedures Procedures (including critical care time)  Medications Ordered in UC Medications - No data to display  Initial Impression / Assessment and Plan / UC Course  I have reviewed the triage vital signs and the nursing notes.  Pertinent labs & imaging results that were available during my care of the patient were reviewed by me and considered in my medical decision making (see chart for details).      MDM: 1.  Lip laceration, initial encounter-Advised/instructed patient to go to [facility] now for immediate evaluation and repair of upper lip laceration.  Patient agreed and verbalized understanding of these instructions and this plan of care this evening.  Patient discharged home, hemodynamically stable Final diagnoses:  Lip laceration, initial encounter     Discharge Instructions      Advised/instructed patient to go to [facility] now for immediate evaluation and repair of upper lip laceration.  Patient agreed and verbalized understanding of these instructions and this plan of care this evening.      ED Prescriptions   None    PDMP not reviewed this encounter.   Trevor Iha, FNP 11/29/20 2044

## 2020-11-29 NOTE — ED Notes (Signed)
Per [facility], pt needs eval (possible plastics spec) at ED for facial lac. No Charge for [facility].

## 2020-11-29 NOTE — Discharge Instructions (Addendum)
Advised/instructed patient to go to [facility] now for immediate evaluation and repair of upper lip laceration.  Patient agreed and verbalized understanding of these instructions and this plan of care this evening.

## 2020-11-30 ENCOUNTER — Telehealth: Payer: Self-pay | Admitting: Sports Medicine

## 2020-11-30 ENCOUNTER — Ambulatory Visit (INDEPENDENT_AMBULATORY_CARE_PROVIDER_SITE_OTHER): Payer: Self-pay | Admitting: Physician Assistant

## 2020-11-30 DIAGNOSIS — Z5329 Procedure and treatment not carried out because of patient's decision for other reasons: Secondary | ICD-10-CM

## 2020-11-30 NOTE — Progress Notes (Signed)
No show

## 2020-11-30 NOTE — Telephone Encounter (Signed)
This very nice gentleman that has been a [facility] patient since 2014 asked that I let someone know what happen to him in urgent care a couple days ago He said he went to our Urgent care on 11/29/20 for a laceration above his lip and a Trevor Iha informed patient that he had somewhere to be at a certain time and it would take a hour to stitch his laceration and told him to go to [facility] Emergency Room.  Patient went to [facility] ER and it took them 10 minutes to stitch his laceration up.  Patient felt that he was treated poorly in our Urgent Care by this provider and asked that I get this complaint to the correct person. Please reach out to patient and let him know that this is being looked into and was received. His phone is [phone]

## 2020-12-01 ENCOUNTER — Ambulatory Visit (INDEPENDENT_AMBULATORY_CARE_PROVIDER_SITE_OTHER): Payer: Self-pay | Admitting: Physician Assistant

## 2020-12-01 ENCOUNTER — Encounter: Payer: Self-pay | Admitting: Physician Assistant

## 2020-12-01 ENCOUNTER — Other Ambulatory Visit: Payer: Self-pay

## 2020-12-01 VITALS — BP 127/80 | HR 66 | Ht 70.0 in | Wt 242.0 lb

## 2020-12-01 DIAGNOSIS — Z20828 Contact with and (suspected) exposure to other viral communicable diseases: Secondary | ICD-10-CM | POA: Insufficient documentation

## 2020-12-01 DIAGNOSIS — Z113 Encounter for screening for infections with a predominantly sexual mode of transmission: Secondary | ICD-10-CM

## 2020-12-01 NOTE — Progress Notes (Signed)
   Subjective:    Patient ID: Carlos Mathews, male    DOB: February 13, 1971, 50 y.o.   MRN: [id number]  HPI Pt is a 50 yo male who presents to the clinic for STI testing after wife was dx with herpes infection outbreak last week. He has never had cold sores or herpes like outbreak. Last week he did have a sore on his lower right lip that was painful. It has since resolved.   .. Active Ambulatory Problems    Diagnosis Date Noted   Fatigue due to sleep pattern disturbance 01/09/2013   Decreased sex drive 40/98/1191   Obesity 01/09/2013   Annual physical exam 06/16/2014   Irritability 06/08/2015   Essential hypertension, benign 06/08/2015   Left cervical radiculopathy 11/12/2015   Lumbar degenerative disc disease 11/12/2015   Osteoarthritis of first metatarsophalangeal (MTP) joint of right foot 08/21/2017   Hyperlipidemia, mixed 03/13/2018   Exposure to herpes simplex virus (HSV) 12/01/2020   Resolved Ambulatory Problems    Diagnosis Date Noted   Preventive measure 01/09/2013   Subconjunctival hemorrhage of left eye 02/04/2013   Hand abrasion, infected 05/08/2013   Sore in nose 08/18/2014   Tick bite of head 10/05/2015   Subacute bronchitis 04/07/2016   Influenza-like illness 05/29/2016   Influenza A 05/28/2018   Cerumen impaction 05/28/2018   No Additional Past Medical History      Review of Systems  All other systems reviewed and are negative.     Objective:   Physical Exam  No rashes, lesions, or vesicles.       Assessment & Plan:   Marland KitchenMarland KitchenJeren was seen today for follow-up.  Diagnoses and all orders for this visit:  Screen for STD (sexually transmitted disease) -     HSV(herpes simplex vrs) 1+2 ab-IgG -     HIV antibody (with reflex) -     C. trachomatis/N. gonorrhoeae RNA -     RPR -     HSV 1/2 Ab (IgM), IFA w/rflx Titer  Exposure to herpes simplex virus (HSV)  Pt could not urinate and did not want to do penile swab.  Urine cup given to collect morning urine  for GC/Chlamydia.  Labs ordered for other STI testing and will call with results.

## 2020-12-02 NOTE — Progress Notes (Signed)
Positive for past infection to herpes simplex 1. No infection to Herpes simplex 2.

## 2020-12-06 NOTE — Telephone Encounter (Signed)
I forwarded the email to Dr. Leonides Grills and ask Dr. Leonides Grills to follow-up with Casimiro Needle.

## 2020-12-09 LAB — HSV 1/2 AB (IGM), IFA W/RFLX TITER
HSV 1 IgM Screen: NEGATIVE
HSV 2 IgM Screen: NEGATIVE

## 2020-12-09 LAB — HSV(HERPES SIMPLEX VRS) I + II AB-IGG
HAV 1 IGG,TYPE SPECIFIC AB: 11.4 index — ABNORMAL HIGH
HSV 2 IGG,TYPE SPECIFIC AB: <0.9 index

## 2020-12-09 LAB — HIV ANTIBODY (ROUTINE TESTING W REFLEX): HIV 1&2 Ab, 4th Generation: NONREACTIVE

## 2020-12-09 LAB — RPR: RPR Ser Ql: NONREACTIVE

## 2020-12-10 NOTE — Progress Notes (Signed)
No immediate herpes exposure based on negative IgM.

## 2021-03-18 ENCOUNTER — Other Ambulatory Visit: Payer: Self-pay | Admitting: Sports Medicine

## 2021-03-18 DIAGNOSIS — I10 Essential (primary) hypertension: Secondary | ICD-10-CM

## 2021-03-28 ENCOUNTER — Other Ambulatory Visit: Payer: Self-pay | Admitting: Sports Medicine

## 2021-03-28 DIAGNOSIS — I10 Essential (primary) hypertension: Secondary | ICD-10-CM

## 2021-03-30 ENCOUNTER — Other Ambulatory Visit: Payer: Self-pay | Admitting: Sports Medicine

## 2021-03-30 DIAGNOSIS — I10 Essential (primary) hypertension: Secondary | ICD-10-CM

## 2021-03-30 DIAGNOSIS — E782 Mixed hyperlipidemia: Secondary | ICD-10-CM

## 2021-04-12 ENCOUNTER — Ambulatory Visit (INDEPENDENT_AMBULATORY_CARE_PROVIDER_SITE_OTHER): Payer: Self-pay | Admitting: Sports Medicine

## 2021-04-12 ENCOUNTER — Other Ambulatory Visit: Payer: Self-pay

## 2021-04-12 VITALS — BP 151/82 | HR 81 | Wt 246.0 lb

## 2021-04-12 DIAGNOSIS — I10 Essential (primary) hypertension: Secondary | ICD-10-CM

## 2021-04-12 DIAGNOSIS — Z0000 Encounter for general adult medical examination without abnormal findings: Secondary | ICD-10-CM

## 2021-04-12 DIAGNOSIS — Z1211 Encounter for screening for malignant neoplasm of colon: Secondary | ICD-10-CM

## 2021-04-12 DIAGNOSIS — E782 Mixed hyperlipidemia: Secondary | ICD-10-CM

## 2021-04-12 NOTE — Assessment & Plan Note (Signed)
This is a pleasant 50 year old male, he does have a history of hypertension, he is off of all medications, was on lisinopril historically. He tells me outside of the health system he has gotten blood pressure checks that were normal, we will hold off on restarting his lisinopril, I would like him to get a diary of home blood pressures after sitting quietly for 10 minutes, and send me a MyChart message or let me know somehow. If they are consistently over 140/90 then we will restart his lisinopril.

## 2021-04-12 NOTE — Progress Notes (Signed)
° ° °  Procedures performed today:    None.  Independent interpretation of notes and tests performed by another provider:   None.  Brief History, Exam, Impression, and Recommendations:    Essential hypertension, benign This is a pleasant 50 year old male, he does have a history of hypertension, he is off of all medications, was on lisinopril historically. He tells me outside of the health system he has gotten blood pressure checks that were normal, we will hold off on restarting his lisinopril, I would like him to get a diary of home blood pressures after sitting quietly for 10 minutes, and send me a MyChart message or let me know somehow. If they are consistently over 140/90 then we will restart his lisinopril.  Hyperlipidemia, mixed Continue cholesterol medication for now. He had stopped it and previous lipid panel showed uncontrolled lipids, but he has restarted.  Annual physical exam Declines all vaccines, restarting Cologuard.    ___________________________________________ Ihor Austin. [name], M.D., ABFM., CAQSM. Primary Care and Sports Medicine [facility]  Adjunct Instructor of Family Medicine  [employer]

## 2021-04-12 NOTE — Assessment & Plan Note (Signed)
Continue cholesterol medication for now. He had stopped it and previous lipid panel showed uncontrolled lipids, but he has restarted.

## 2021-04-12 NOTE — Assessment & Plan Note (Signed)
Declines all vaccines, restarting Cologuard.

## 2021-04-27 ENCOUNTER — Encounter: Payer: Self-pay | Admitting: Sports Medicine

## 2021-04-27 DIAGNOSIS — I10 Essential (primary) hypertension: Secondary | ICD-10-CM

## 2021-04-27 MED ORDER — LISINOPRIL 10 MG PO TABS: 10.0000 mg | ORAL_TABLET | Freq: Every day | ORAL | 3 refills | Status: DC

## 2021-04-27 NOTE — Assessment & Plan Note (Signed)
Blood pressure diary reviewed, systolics are above 90, I do think we should restart lisinopril albeit at 10 mg rather than 20, he can send another blood pressure diary for the week or 2 after starting lisinopril.

## 2021-05-22 LAB — COLOGUARD: COLOGUARD: NEGATIVE

## 2021-07-16 ENCOUNTER — Other Ambulatory Visit: Payer: Self-pay | Admitting: Sports Medicine

## 2021-07-16 DIAGNOSIS — I10 Essential (primary) hypertension: Secondary | ICD-10-CM

## 2021-07-18 ENCOUNTER — Other Ambulatory Visit: Payer: Self-pay

## 2021-07-18 ENCOUNTER — Ambulatory Visit (INDEPENDENT_AMBULATORY_CARE_PROVIDER_SITE_OTHER): Payer: Self-pay | Admitting: Sports Medicine

## 2021-07-18 DIAGNOSIS — M1711 Unilateral primary osteoarthritis, right knee: Secondary | ICD-10-CM

## 2021-07-18 DIAGNOSIS — R6882 Decreased libido: Secondary | ICD-10-CM

## 2021-07-18 DIAGNOSIS — S83206A Unspecified tear of unspecified meniscus, current injury, right knee, initial encounter: Secondary | ICD-10-CM | POA: Insufficient documentation

## 2021-07-18 DIAGNOSIS — I10 Essential (primary) hypertension: Secondary | ICD-10-CM

## 2021-07-18 DIAGNOSIS — E782 Mixed hyperlipidemia: Secondary | ICD-10-CM

## 2021-07-18 MED ORDER — MELOXICAM 15 MG PO TABS: ORAL_TABLET | ORAL | 3 refills | Status: DC

## 2021-07-18 MED ORDER — LISINOPRIL 10 MG PO TABS: 10.0000 mg | ORAL_TABLET | Freq: Every day | ORAL | 3 refills | Status: DC

## 2021-07-18 MED ORDER — ATORVASTATIN CALCIUM 10 MG PO TABS: 10.0000 mg | ORAL_TABLET | Freq: Every day | ORAL | 3 refills | Status: DC

## 2021-07-18 MED ORDER — TADALAFIL 5 MG PO TABS: 5.0000 mg | ORAL_TABLET | Freq: Every day | ORAL | 11 refills | Status: DC

## 2021-07-18 MED ORDER — ACETAMINOPHEN ER 650 MG PO TBCR: 650.0000 mg | EXTENDED_RELEASE_TABLET | Freq: Three times a day (TID) | ORAL | 3 refills | Status: AC | PRN

## 2021-07-18 NOTE — Assessment & Plan Note (Signed)
Refilling atorvastatin, rechecking lipids. ?

## 2021-07-18 NOTE — Assessment & Plan Note (Signed)
Switching sildenafil to Cialis, he will see how much it is at [facility] and may use a good Rx coupon if too expensive. ?

## 2021-07-18 NOTE — Assessment & Plan Note (Signed)
Refilling blood pressure medication, checking routine labs. ?

## 2021-07-18 NOTE — Assessment & Plan Note (Signed)
Several weeks of increasing right knee pain, medial joint line, mild swelling, similar on the left. ?Moderate gelling. ?Exam is benign with exception of medial joint line pain and pain with terminal flexion, no mechanical symptoms. ?He does have a race coming up this weekend. ?We will try to get him feeling better enough to do the raise, adding x-rays, he will do a over-the-counter knee sleeve, home conditioning, meloxicam and arthritis from Tylenol. ?Ice after runs. ?Return to see me in 6 weeks, injection if not better. ?

## 2021-07-18 NOTE — Progress Notes (Signed)
? ? ?  Procedures performed today:   ? ?None. ? ?Independent interpretation of notes and tests performed by another provider:  ? ?None. ? ?Brief History, Exam, Impression, and Recommendations:   ? ?Essential hypertension, benign ?Refilling blood pressure medication, checking routine labs. ? ?Hyperlipidemia, mixed ?Refilling atorvastatin, rechecking lipids. ? ?Decreased sex drive ?Switching sildenafil to Cialis, he will see how much it is at [facility] and may use a good Rx coupon if too expensive. ? ?Primary osteoarthritis of right knee ?Several weeks of increasing right knee pain, medial joint line, mild swelling, similar on the left. ?Moderate gelling. ?Exam is benign with exception of medial joint line pain and pain with terminal flexion, no mechanical symptoms. ?He does have a race coming up this weekend. ?We will try to get him feeling better enough to do the raise, adding x-rays, he will do a over-the-counter knee sleeve, home conditioning, meloxicam and arthritis from Tylenol. ?Ice after runs. ?Return to see me in 6 weeks, injection if not better. ? ? ? ?___________________________________________ ?Carlos Mathews. Carlos Mathews, M.D., ABFM., CAQSM. ?Primary Care and Sports Mathews ?[facility] ? ?Adjunct Instructor of Family Mathews  ?Carlos Mathews ?

## 2021-08-07 ENCOUNTER — Encounter: Payer: Self-pay | Admitting: Sports Medicine

## 2021-08-29 ENCOUNTER — Ambulatory Visit (INDEPENDENT_AMBULATORY_CARE_PROVIDER_SITE_OTHER): Payer: Self-pay

## 2021-08-29 ENCOUNTER — Ambulatory Visit (INDEPENDENT_AMBULATORY_CARE_PROVIDER_SITE_OTHER): Payer: Self-pay | Admitting: Sports Medicine

## 2021-08-29 VITALS — BP 135/87

## 2021-08-29 DIAGNOSIS — E782 Mixed hyperlipidemia: Secondary | ICD-10-CM

## 2021-08-29 DIAGNOSIS — I10 Essential (primary) hypertension: Secondary | ICD-10-CM

## 2021-08-29 DIAGNOSIS — M1711 Unilateral primary osteoarthritis, right knee: Secondary | ICD-10-CM

## 2021-08-29 NOTE — Assessment & Plan Note (Signed)
Persistent pain and spite of acetaminophen, he did decrease the arch in his shoes which has helped a little bit, I suspect by unloading the medial compartment. ?Today we did an injection due to persistent discomfort, return to see me in 4 to 6 weeks as needed. ?

## 2021-08-29 NOTE — Progress Notes (Addendum)
? ? ?  Procedures performed today:   ? ?Procedure: Real-time Ultrasound Guided injection of the right knee ?Device: Samsung HS60  ?Verbal informed consent obtained.  ?Time-out conducted.  ?Noted no overlying erythema, induration, or other signs of local infection.  ?Skin prepped in a sterile fashion.  ?Local anesthesia: Topical Ethyl chloride.  ?With sterile technique and under real time ultrasound guidance: Trace effusion noted, 1 cc Kenalog 40, 2 cc lidocaine, 2 cc bupivacaine injected easily ?Completed without difficulty  ?Advised to call if fevers/chills, erythema, induration, drainage, or persistent bleeding.  ?Images permanently stored and available for review in PACS.  ?Impression: Technically successful ultrasound guided injection. ? ?Independent interpretation of notes and tests performed by another provider:  ? ?None. ? ?Brief History, Exam, Impression, and Recommendations:   ? ?Primary osteoarthritis of right knee ?Persistent pain and spite of acetaminophen, he did decrease the arch in his shoes which has helped a little bit, I suspect by unloading the medial compartment. ?Today we did an injection due to persistent discomfort, return to see me in 4 to 6 weeks as needed. ? ?Hyperlipidemia, mixed ?Lipids are elevated, LDL goal less than 100, increasing atorvastatin to 20 mg daily with a 44-month fasting lipid recheck. ? ?Chronic process with exacerbation and pharmacologic intervention ? ?___________________________________________ ?Ihor Austin. [name], M.D., ABFM., CAQSM. ?Primary Care and Sports Medicine ?[facility] ? ?Adjunct Instructor of Family Medicine  ?[employer] ?

## 2021-08-30 LAB — COMPREHENSIVE METABOLIC PANEL
AG Ratio: 1.8 (calc) (ref 1.0–2.5)
ALT: 17 U/L (ref 9–46)
AST: 13 U/L (ref 10–35)
Albumin: 4.7 g/dL (ref 3.6–5.1)
Alkaline phosphatase (APISO): 58 U/L (ref 35–144)
BUN: 20 mg/dL (ref 7–25)
CO2: 27 mmol/L (ref 20–32)
Calcium: 9.9 mg/dL (ref 8.6–10.3)
Chloride: 103 mmol/L (ref 98–110)
Creat: 1.11 mg/dL (ref 0.70–1.30)
Globulin: 2.6 g/dL (calc) (ref 1.9–3.7)
Glucose, Bld: 97 mg/dL (ref 65–99)
Potassium: 4.5 mmol/L (ref 3.5–5.3)
Sodium: 138 mmol/L (ref 135–146)
Total Bilirubin: 1.2 mg/dL (ref 0.2–1.2)
Total Protein: 7.3 g/dL (ref 6.1–8.1)

## 2021-08-30 LAB — CBC
HCT: 43.6 % (ref 38.5–50.0)
Hemoglobin: 14.5 g/dL (ref 13.2–17.1)
MCH: 28.9 pg (ref 27.0–33.0)
MCHC: 33.3 g/dL (ref 32.0–36.0)
MCV: 87 fL (ref 80.0–100.0)
MPV: 10.3 fL (ref 7.5–12.5)
Platelets: 264 10*3/uL (ref 140–400)
RBC: 5.01 10*6/uL (ref 4.20–5.80)
RDW: 13.1 % (ref 11.0–15.0)
WBC: 5 10*3/uL (ref 3.8–10.8)

## 2021-08-30 LAB — TSH: TSH: 1.02 mIU/L (ref 0.40–4.50)

## 2021-08-30 LAB — LIPID PANEL
Cholesterol: 205 mg/dL — ABNORMAL HIGH (ref <200)
HDL: 45 mg/dL (ref >=40)
LDL Cholesterol (Calc): 133 mg/dL (calc) — ABNORMAL HIGH
Non-HDL Cholesterol (Calc): 160 mg/dL (calc) — ABNORMAL HIGH (ref <130)
Total CHOL/HDL Ratio: 4.6 (calc) (ref <5.0)
Triglycerides: 145 mg/dL (ref <150)

## 2021-08-30 MED ORDER — ATORVASTATIN CALCIUM 20 MG PO TABS: 20.0000 mg | ORAL_TABLET | Freq: Every day | ORAL | 3 refills | Status: DC

## 2021-08-30 NOTE — Assessment & Plan Note (Signed)
Lipids are elevated, LDL goal less than 100, increasing atorvastatin to 20 mg daily with a 60-month fasting lipid recheck. ?

## 2021-08-30 NOTE — Addendum Note (Signed)
Addended by: Silverio Decamp on: 08/30/2021 10:05 AM ? ? Modules accepted: Orders ? ?

## 2021-10-06 ENCOUNTER — Other Ambulatory Visit: Payer: Self-pay | Admitting: Sports Medicine

## 2021-10-06 DIAGNOSIS — I10 Essential (primary) hypertension: Secondary | ICD-10-CM

## 2021-10-11 ENCOUNTER — Ambulatory Visit (INDEPENDENT_AMBULATORY_CARE_PROVIDER_SITE_OTHER): Payer: Self-pay

## 2021-10-11 DIAGNOSIS — Z09 Encounter for follow-up examination after completed treatment for conditions other than malignant neoplasm: Secondary | ICD-10-CM

## 2021-10-11 DIAGNOSIS — M1711 Unilateral primary osteoarthritis, right knee: Secondary | ICD-10-CM

## 2021-10-12 ENCOUNTER — Ambulatory Visit (INDEPENDENT_AMBULATORY_CARE_PROVIDER_SITE_OTHER): Payer: Self-pay | Admitting: Sports Medicine

## 2021-10-12 DIAGNOSIS — S83241A Other tear of medial meniscus, current injury, right knee, initial encounter: Secondary | ICD-10-CM

## 2021-10-12 NOTE — Progress Notes (Signed)
    Procedures performed today:    None.  Independent interpretation of notes and tests performed by another provider:   None.  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 Unfortunately he has been having severe bilateral knee pain, right worse than left, mostly at the medial joint line and worse with flexion and twisting. We did a knee injection at the last visit as well as some x-rays, x-rays did show some mild degenerative changes. Injection gave him a couple weeks of relief and then the pain returned. I do think we need to proceed with an MRI considering persistent pain in spite of 6 weeks of conservative treatment. This is for arthroscopy planning as I do think he has a meniscal injury.    ___________________________________________ Carlos Mathews. [name], M.D., ABFM., CAQSM. Primary Care and Sports Medicine [facility]  Adjunct Instructor of Family Medicine  [employer]

## 2021-10-12 NOTE — Assessment & Plan Note (Signed)
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 Unfortunately he has been having severe bilateral knee pain, right worse than left, mostly at the medial joint line and worse with flexion and twisting. We did a knee injection at the last visit as well as some x-rays, x-rays did show some mild degenerative changes. Injection gave him a couple weeks of relief and then the pain returned. I do think we need to proceed with an MRI considering persistent pain in spite of 6 weeks of conservative treatment. This is for arthroscopy planning as I do think he has a meniscal injury.

## 2021-10-17 ENCOUNTER — Encounter: Payer: Self-pay | Admitting: Sports Medicine

## 2021-10-17 ENCOUNTER — Ambulatory Visit (INDEPENDENT_AMBULATORY_CARE_PROVIDER_SITE_OTHER): Payer: Self-pay

## 2021-10-17 DIAGNOSIS — S83241A Other tear of medial meniscus, current injury, right knee, initial encounter: Secondary | ICD-10-CM

## 2021-10-17 DIAGNOSIS — R6882 Decreased libido: Secondary | ICD-10-CM

## 2021-11-03 ENCOUNTER — Other Ambulatory Visit: Payer: Self-pay | Admitting: Sports Medicine

## 2021-11-03 DIAGNOSIS — R6882 Decreased libido: Secondary | ICD-10-CM

## 2021-11-08 MED ORDER — SILDENAFIL CITRATE 20 MG PO TABS: 20.0000 mg | ORAL_TABLET | ORAL | 11 refills | Status: AC | PRN

## 2021-12-19 ENCOUNTER — Other Ambulatory Visit: Payer: Self-pay | Admitting: Sports Medicine

## 2021-12-19 DIAGNOSIS — M1711 Unilateral primary osteoarthritis, right knee: Secondary | ICD-10-CM

## 2022-04-25 ENCOUNTER — Encounter: Payer: Self-pay | Admitting: Sports Medicine

## 2022-05-29 ENCOUNTER — Encounter (INDEPENDENT_AMBULATORY_CARE_PROVIDER_SITE_OTHER): Payer: Self-pay | Admitting: Sports Medicine

## 2022-05-29 DIAGNOSIS — R140 Abdominal distension (gaseous): Secondary | ICD-10-CM

## 2022-05-30 NOTE — Telephone Encounter (Signed)
I spent 5 total minutes of online digital evaluation and management services in this patient-initiated request for online care. 

## 2022-08-07 ENCOUNTER — Other Ambulatory Visit: Payer: Self-pay | Admitting: Sports Medicine

## 2022-08-07 ENCOUNTER — Encounter: Payer: Self-pay | Admitting: Sports Medicine

## 2022-08-07 DIAGNOSIS — R6882 Decreased libido: Secondary | ICD-10-CM

## 2022-08-07 MED ORDER — TADALAFIL 5 MG PO TABS: 5.0000 mg | ORAL_TABLET | Freq: Every day | ORAL | 0 refills | Status: DC

## 2022-08-14 ENCOUNTER — Encounter: Payer: Self-pay | Admitting: Sports Medicine

## 2022-08-14 ENCOUNTER — Ambulatory Visit (INDEPENDENT_AMBULATORY_CARE_PROVIDER_SITE_OTHER): Payer: Self-pay | Admitting: Sports Medicine

## 2022-08-14 VITALS — BP 137/93 | HR 83 | Ht 70.0 in | Wt 251.0 lb

## 2022-08-14 DIAGNOSIS — E291 Testicular hypofunction: Secondary | ICD-10-CM

## 2022-08-14 DIAGNOSIS — R6882 Decreased libido: Secondary | ICD-10-CM

## 2022-08-14 DIAGNOSIS — I10 Essential (primary) hypertension: Secondary | ICD-10-CM

## 2022-08-14 MED ORDER — TADALAFIL 5 MG PO TABS: 5.0000 mg | ORAL_TABLET | Freq: Every day | ORAL | 3 refills | Status: DC

## 2022-08-14 NOTE — Assessment & Plan Note (Signed)
Much improved with testosterone and tadalafil.

## 2022-08-14 NOTE — Assessment & Plan Note (Signed)
Currently getting testosterone supplementation from an outside source, he is feeling better. They do check his CBC, PSA as well.

## 2022-08-14 NOTE — Progress Notes (Signed)
    Procedures performed today:    None.  Independent interpretation of notes and tests performed by another provider:   None.  Brief History, Exam, Impression, and Recommendations:    Male hypogonadism Currently getting testosterone supplementation from an outside source, he is feeling better. They do check his CBC, PSA as well.   Decreased sex drive Much improved with testosterone and tadalafil.    ____________________________________________ Ihor Austin. [name], M.D., ABFM., CAQSM., AME. Primary Care and Sports Medicine [facility]  Adjunct Professor of Family Medicine  [place] [employer] [facility] [employer]  Restaurant manager, fast food

## 2022-10-04 ENCOUNTER — Encounter: Payer: Self-pay | Admitting: Sports Medicine

## 2022-10-04 DIAGNOSIS — E782 Mixed hyperlipidemia: Secondary | ICD-10-CM

## 2022-10-04 DIAGNOSIS — I10 Essential (primary) hypertension: Secondary | ICD-10-CM

## 2022-10-04 LAB — COMPREHENSIVE METABOLIC PANEL
AG Ratio: 1.8 (calc) (ref 1.0–2.5)
ALT: 32 U/L (ref 9–46)
AST: 24 U/L (ref 10–35)
Albumin: 4.6 g/dL (ref 3.6–5.1)
Alkaline phosphatase (APISO): 62 U/L (ref 35–144)
BUN: 22 mg/dL (ref 7–25)
CO2: 27 mmol/L (ref 20–32)
Calcium: 9.8 mg/dL (ref 8.6–10.3)
Chloride: 103 mmol/L (ref 98–110)
Creat: 1.01 mg/dL (ref 0.70–1.30)
Globulin: 2.5 g/dL (calc) (ref 1.9–3.7)
Glucose, Bld: 89 mg/dL (ref 65–99)
Potassium: 4.7 mmol/L (ref 3.5–5.3)
Sodium: 139 mmol/L (ref 135–146)
Total Bilirubin: 1.4 mg/dL — ABNORMAL HIGH (ref 0.2–1.2)
Total Protein: 7.1 g/dL (ref 6.1–8.1)

## 2022-10-04 LAB — LIPID PANEL
Cholesterol: 200 mg/dL — ABNORMAL HIGH (ref <200)
HDL: 38 mg/dL — ABNORMAL LOW (ref >=40)
LDL Cholesterol (Calc): 137 mg/dL (calc) — ABNORMAL HIGH
Non-HDL Cholesterol (Calc): 162 mg/dL (calc) — ABNORMAL HIGH (ref <130)
Total CHOL/HDL Ratio: 5.3 (calc) — ABNORMAL HIGH (ref <5.0)
Triglycerides: 126 mg/dL (ref <150)

## 2022-10-04 LAB — HEMOGLOBIN A1C
Hgb A1c MFr Bld: 5.6 % of total Hgb (ref <5.7)
Mean Plasma Glucose: 114 mg/dL
eAG (mmol/L): 6.3 mmol/L

## 2022-10-04 LAB — TSH: TSH: 1.42 mIU/L (ref 0.40–4.50)

## 2022-10-05 MED ORDER — ATORVASTATIN CALCIUM 20 MG PO TABS
20.0000 mg | ORAL_TABLET | Freq: Every day | ORAL | 3 refills | Status: DC
Start: 2022-10-05 — End: 2023-02-14

## 2022-10-05 NOTE — Progress Notes (Signed)
Message between patient and provider. Was out of medication and refill was sent.

## 2022-10-09 ENCOUNTER — Other Ambulatory Visit: Payer: Self-pay | Admitting: Sports Medicine

## 2022-10-09 DIAGNOSIS — I10 Essential (primary) hypertension: Secondary | ICD-10-CM

## 2022-11-16 ENCOUNTER — Encounter: Payer: Self-pay | Admitting: Sports Medicine

## 2022-12-28 ENCOUNTER — Other Ambulatory Visit: Payer: Self-pay | Admitting: Sports Medicine

## 2022-12-28 DIAGNOSIS — I10 Essential (primary) hypertension: Secondary | ICD-10-CM

## 2023-02-13 ENCOUNTER — Ambulatory Visit (INDEPENDENT_AMBULATORY_CARE_PROVIDER_SITE_OTHER): Payer: Self-pay | Admitting: Sports Medicine

## 2023-02-13 ENCOUNTER — Encounter: Payer: Self-pay | Admitting: Sports Medicine

## 2023-02-13 VITALS — BP 149/97 | HR 58 | Ht 70.0 in | Wt 239.0 lb

## 2023-02-13 DIAGNOSIS — E782 Mixed hyperlipidemia: Secondary | ICD-10-CM

## 2023-02-13 DIAGNOSIS — I10 Essential (primary) hypertension: Secondary | ICD-10-CM

## 2023-02-13 DIAGNOSIS — Z0000 Encounter for general adult medical examination without abnormal findings: Secondary | ICD-10-CM

## 2023-02-13 NOTE — Progress Notes (Addendum)
    Procedures performed today:    None.  Independent interpretation of notes and tests performed by another provider:   None.  Brief History, Exam, Impression, and Recommendations:    Hyperlipidemia, mixed Bill returns, he has been more consistent with his atorvastatin. Rechecking lipids today.Marland Kitchen  Update:Lipids continue to be markedly elevated, we are going to switch from atorvastatin to Crestor.  Recheck lipids fasting in 3 months.  Annual physical exam Patient will return next year for physical, declines flu shot today.  Essential hypertension, benign Blood pressure is elevated, he tells me it was normal at a recent chiropractor visit. We will have him check it at home and MyChart me what the numbers look like and this will let us know if we need to augment his lisinopril dosage.    ____________________________________________ Ihor Austin. [name], M.D., ABFM., CAQSM., AME. Primary Care and Sports Medicine [facility]  Adjunct Professor of Family Medicine  [place] [employer]  Restaurant manager, fast food

## 2023-02-13 NOTE — Assessment & Plan Note (Signed)
Carlos Mathews returns, he has been more consistent with his atorvastatin. Rechecking lipids today.Marland Kitchen  Update:Lipids continue to be markedly elevated, we are going to switch from atorvastatin to Crestor.  Recheck lipids fasting in 3 months.

## 2023-02-13 NOTE — Patient Instructions (Signed)
Please check your blood pressure at home after resting for 15 minutes and send me a 2-week log on MyChart.

## 2023-02-13 NOTE — Assessment & Plan Note (Signed)
Patient will return next year for physical, declines flu shot today.

## 2023-02-13 NOTE — Assessment & Plan Note (Signed)
Blood pressure is elevated, he tells me it was normal at a recent chiropractor visit. We will have him check it at home and MyChart me what the numbers look like and this will let us know if we need to augment his lisinopril dosage.

## 2023-02-14 LAB — COMPREHENSIVE METABOLIC PANEL
ALT: 26 [IU]/L (ref 0–44)
AST: 23 [IU]/L (ref 0–40)
Albumin: 4.2 g/dL (ref 3.8–4.9)
Alkaline Phosphatase: 72 [IU]/L (ref 44–121)
BUN/Creatinine Ratio: 15 (ref 9–20)
BUN: 17 mg/dL (ref 6–24)
Bilirubin Total: 1.2 mg/dL (ref 0.0–1.2)
CO2: 25 mmol/L (ref 20–29)
Calcium: 9.4 mg/dL (ref 8.7–10.2)
Chloride: 101 mmol/L (ref 96–106)
Creatinine, Ser: 1.13 mg/dL (ref 0.76–1.27)
Globulin, Total: 2.7 g/dL (ref 1.5–4.5)
Glucose: 90 mg/dL (ref 70–99)
Potassium: 4.8 mmol/L (ref 3.5–5.2)
Sodium: 140 mmol/L (ref 134–144)
Total Protein: 6.9 g/dL (ref 6.0–8.5)
eGFR: 78 mL/min/{1.73_m2} (ref >59)

## 2023-02-14 LAB — LIPID PANEL
Chol/HDL Ratio: 6.3 ratio — ABNORMAL HIGH (ref 0.0–5.0)
Cholesterol, Total: 208 mg/dL — ABNORMAL HIGH (ref 100–199)
HDL: 33 mg/dL — ABNORMAL LOW (ref >39)
LDL Chol Calc (NIH): 152 mg/dL — ABNORMAL HIGH (ref 0–99)
Triglycerides: 124 mg/dL (ref 0–149)
VLDL Cholesterol Cal: 23 mg/dL (ref 5–40)

## 2023-02-14 MED ORDER — ROSUVASTATIN CALCIUM 20 MG PO TABS
20.0000 mg | ORAL_TABLET | Freq: Every day | ORAL | 3 refills | Status: AC
Start: 2023-02-14 — End: ?

## 2023-02-14 NOTE — Addendum Note (Signed)
Addended by: Monica Becton on: 02/14/2023 10:35 AM   Modules accepted: Orders

## 2023-03-27 ENCOUNTER — Other Ambulatory Visit: Payer: Self-pay | Admitting: Sports Medicine

## 2023-03-27 DIAGNOSIS — I10 Essential (primary) hypertension: Secondary | ICD-10-CM

## 2023-05-03 ENCOUNTER — Ambulatory Visit (INDEPENDENT_AMBULATORY_CARE_PROVIDER_SITE_OTHER): Payer: Self-pay | Admitting: Sports Medicine

## 2023-05-03 ENCOUNTER — Encounter: Payer: Self-pay | Admitting: Sports Medicine

## 2023-05-03 VITALS — BP 139/83 | HR 65

## 2023-05-03 DIAGNOSIS — E782 Mixed hyperlipidemia: Secondary | ICD-10-CM

## 2023-05-03 DIAGNOSIS — R6882 Decreased libido: Secondary | ICD-10-CM

## 2023-05-03 DIAGNOSIS — N529 Male erectile dysfunction, unspecified: Secondary | ICD-10-CM

## 2023-05-03 MED ORDER — TADALAFIL 10 MG PO TABS: 10.0000 mg | ORAL_TABLET | Freq: Every day | ORAL | 11 refills | Status: AC

## 2023-05-03 NOTE — Assessment & Plan Note (Signed)
 Zell is a very pleasant 53 year old male, he has historically done well with Cialis  5 mg daily. He endorses it works well but occasionally he will lose erection in the middle of intercourse. We discussed the pathology and the physiology of erectile function including Cialis  increasing blood flow into the corpus cavernosum, this in turn compressing venous outflow and resulting in erection, we discussed bumping up tadalafil , he would do 5 mg daily with 10 mg on days he is going to have intercourse, we also discussed penile compressive rings, he will try all of the above. He can discuss this with me in a few months.

## 2023-05-03 NOTE — Progress Notes (Signed)
    Procedures performed today:    None.  Independent interpretation of notes and tests performed by another provider:   None.  Brief History, Exam, Impression, and Recommendations:    Erectile dysfunction Zell is a very pleasant 53 year old male, he has historically done well with Cialis  5 mg daily. He endorses it works well but occasionally he will lose erection in the middle of intercourse. We discussed the pathology and the physiology of erectile function including Cialis  increasing blood flow into the corpus cavernosum, this in turn compressing venous outflow and resulting in erection, we discussed bumping up tadalafil , he would do 5 mg daily with 10 mg on days he is going to have intercourse, we also discussed penile compressive rings, he will try all of the above. He can discuss this with me in a few months.  Hyperlipidemia, mixed Zell has been taking rosuvastatin , he will come back next week for fasting labs.  Chronic process with exacerbation and pharmacologic intervention  ____________________________________________ Debby PARAS. Curtis, M.D., ABFM., CAQSM., AME. Primary Care and Sports Medicine [facility]  Adjunct Professor of [facility] Medicine  [employer]  Restaurant Manager, [employer]

## 2023-05-03 NOTE — Assessment & Plan Note (Signed)
 Carlos Mathews has been taking rosuvastatin, he will come back next week for fasting labs.

## 2023-05-25 ENCOUNTER — Other Ambulatory Visit: Payer: Self-pay | Admitting: Sports Medicine

## 2023-05-25 DIAGNOSIS — R6882 Decreased libido: Secondary | ICD-10-CM

## 2023-10-30 ENCOUNTER — Encounter: Payer: Self-pay | Admitting: Sports Medicine

## 2023-10-30 ENCOUNTER — Ambulatory Visit (INDEPENDENT_AMBULATORY_CARE_PROVIDER_SITE_OTHER): Payer: Self-pay | Admitting: Sports Medicine

## 2023-10-30 VITALS — BP 125/74 | HR 69 | Resp 20 | Ht 70.0 in

## 2023-10-30 DIAGNOSIS — R110 Nausea: Secondary | ICD-10-CM

## 2023-10-30 DIAGNOSIS — I10 Essential (primary) hypertension: Secondary | ICD-10-CM

## 2023-10-30 DIAGNOSIS — R1084 Generalized abdominal pain: Secondary | ICD-10-CM

## 2023-10-30 MED ORDER — PANTOPRAZOLE SODIUM 40 MG PO TBEC: 40.0000 mg | DELAYED_RELEASE_TABLET | Freq: Every day | ORAL | 3 refills | Status: AC

## 2023-10-30 NOTE — Assessment & Plan Note (Signed)
 Very pleasant 53 year old male, historically blood pressures run somewhat high, more recently he has noted increasing weakness, presyncopal symptoms such as pulsating noises in his ears when standing, nausea, he does a lot of work outside in the heat. Urine is fairly dark, the color of honey. Blood pressures are fairly low/soft today. Exam is normal. We will get some labs including urinalysis. We have emphasized hydration with electrolyte containing beverages, he should keep hydrating until his urine is pale. I have also advised him to keep a thermometer with him, if he gets hot working outside I would like him to check his temperature to help evaluate if he is developing any heat related illness. I like to see him back in about 4 weeks to see how things are going.

## 2023-10-30 NOTE — Assessment & Plan Note (Signed)
 Carlos Mathews is also having nausea, early satiety, he has been using a lot of Excedrin, ibuprofen , naproxen. Sometimes does this on empty stomach. No melena, hematochezia, hematemesis. We will check some labs, adding pantoprazole , he will hold off on all NSAIDs and use Tylenol  for pain instead, hydrate aggressively. Follow this up in 4 weeks.

## 2023-10-30 NOTE — Progress Notes (Signed)
    Procedures performed today:    None.  Independent interpretation of notes and tests performed by another provider:   None.  Brief History, Exam, Impression, and Recommendations:    Essential hypertension, benign Very pleasant 53 year old male, historically blood pressures run somewhat high, more recently he has noted increasing weakness, presyncopal symptoms such as pulsating noises in his ears when standing, nausea, he does a lot of work outside in the heat. Urine is fairly dark, the color of honey. Blood pressures are fairly low/soft today. Exam is normal. We will get some labs including urinalysis. We have emphasized hydration with electrolyte containing beverages, he should keep hydrating until his urine is pale. I have also advised him to keep a thermometer with him, if he gets hot working outside I would like him to check his temperature to help evaluate if he is developing any heat related illness. I like to see him back in about 4 weeks to see how things are going.   Nausea Carlos Mathews is also having nausea, early satiety, he has been using a lot of Excedrin, ibuprofen , naproxen. Sometimes does this on empty stomach. No melena, hematochezia, hematemesis. We will check some labs, adding pantoprazole , he will hold off on all NSAIDs and use Tylenol  for pain instead, hydrate aggressively. Follow this up in 4 weeks.    ____________________________________________ Debby PARAS. Curtis, M.D., ABFM., CAQSM., AME. Primary Care and Sports Medicine  [facility]  Adjunct Professor of [facility] Medicine  [employer]  Restaurant manager, fast food

## 2023-11-01 LAB — COMPREHENSIVE METABOLIC PANEL WITH GFR
ALT: 23 IU/L (ref 0–44)
AST: 25 IU/L (ref 0–40)
Albumin: 4.4 g/dL (ref 3.8–4.9)
Alkaline Phosphatase: 69 IU/L (ref 44–121)
BUN/Creatinine Ratio: 17 (ref 9–20)
BUN: 18 mg/dL (ref 6–24)
Bilirubin Total: 1.1 mg/dL (ref 0.0–1.2)
CO2: 22 mmol/L (ref 20–29)
Calcium: 9.6 mg/dL (ref 8.7–10.2)
Chloride: 100 mmol/L (ref 96–106)
Creatinine, Ser: 1.04 mg/dL (ref 0.76–1.27)
Globulin, Total: 2.7 g/dL (ref 1.5–4.5)
Glucose: 79 mg/dL (ref 70–99)
Potassium: 5 mmol/L (ref 3.5–5.2)
Sodium: 139 mmol/L (ref 134–144)
Total Protein: 7.1 g/dL (ref 6.0–8.5)
eGFR: 85 mL/min/1.73 (ref >59)

## 2023-11-01 LAB — MICROSCOPIC EXAMINATION
Bacteria, UA: NONE SEEN
Casts: NONE SEEN /LPF
RBC, Urine: NONE SEEN /HPF (ref 0–2)
WBC, UA: NONE SEEN /HPF (ref 0–5)

## 2023-11-01 LAB — UA/M W/RFLX CULTURE, ROUTINE
Bilirubin, UA: NEGATIVE
Ketones, UA: NEGATIVE
Leukocytes,UA: NEGATIVE
Nitrite, UA: NEGATIVE
RBC, UA: NEGATIVE
Specific Gravity, UA: 1.024 (ref 1.005–1.030)
Urobilinogen, Ur: 0.2 mg/dL (ref 0.2–1.0)
pH, UA: 8 — ABNORMAL HIGH (ref 5.0–7.5)

## 2023-11-01 LAB — CBC
Hematocrit: 52.9 % — ABNORMAL HIGH (ref 37.5–51.0)
Hemoglobin: 17.3 g/dL (ref 13.0–17.7)
MCH: 29.7 pg (ref 26.6–33.0)
MCHC: 32.7 g/dL (ref 31.5–35.7)
MCV: 91 fL (ref 79–97)
Platelets: 196 x10E3/uL (ref 150–450)
RBC: 5.83 x10E6/uL — ABNORMAL HIGH (ref 4.14–5.80)
RDW: 13.7 % (ref 11.6–15.4)
WBC: 7.9 x10E3/uL (ref 3.4–10.8)

## 2023-11-01 LAB — H. PYLORI BREATH TEST: H pylori Breath Test: NEGATIVE

## 2023-11-05 ENCOUNTER — Ambulatory Visit: Payer: Self-pay | Admitting: *Deleted

## 2023-11-05 NOTE — Telephone Encounter (Signed)
 Copied from CRM [id number]. Topic: Clinical - Red Word Triage >> Nov 05, 2023 12:07 PM Fredrica W wrote: Red Word that prompted transfer to Nurse Triage: Right ear pain, loss of hearing Reason for Disposition  Earache  (Exceptions: Brief ear pain of lasting less than 60 minutes, or earache occurring during air travel.)  Answer Assessment - Initial Assessment Questions 1. LOCATION: Which ear is involved?     I'm having pain in my right ear.   There is a lot of wax and it's really red and hurting. 2. ONSET: When did the ear pain start?      Just started Carlos Mathews.   I can't hear very well.  I have one of those instruments where you can look in your ear. 3. SEVERITY: How bad is the pain?  (Scale 1-10; mild, moderate or severe)     Yes it hurts 4. URI SYMPTOMS: Do you have a runny nose or cough?     No 5. FEVER: Do you have a fever? If Yes, ask: What is your temperature, how was it measured, and when did it start?     No 6. CAUSE: Have you been swimming recently?, How often do you use Q-TIPS?, Have you had any recent air travel or scuba diving?     No 7. OTHER SYMPTOMS: Do you have any other symptoms? (e.g., decreased hearing, dizziness, headache, stiff neck, vomiting)     I can't hear as well 8. PREGNANCY: Is there any chance you are pregnant? When was your last menstrual period?     N/A  Protocols used: Earache-A-AH FYI Only or Action Required?: Action required by provider: request for appointment.  Patient was last seen in primary care on 10/30/2023 by Curtis Debby PARAS, MD.  Called Nurse Triage reporting Otalgia.  Symptoms began several days ago.  Interventions attempted: Nothing.  Symptoms are: gradually worsening.  Triage Disposition: See Physician Within 24 Hours The office was closed for lunch so unable to transfer to be scheduled to see Dr. Curtis.  [facility] call center does not schedule for him.    Message being sent.  Patient/caregiver understands  and will follow disposition?: Yes

## 2023-11-12 ENCOUNTER — Ambulatory Visit: Payer: Self-pay | Admitting: Sports Medicine

## 2023-12-05 ENCOUNTER — Ambulatory Visit: Payer: Self-pay | Admitting: Sports Medicine

## 2023-12-06 ENCOUNTER — Ambulatory Visit: Payer: Self-pay | Admitting: Sports Medicine

## 2023-12-25 ENCOUNTER — Encounter: Payer: Self-pay | Admitting: Sports Medicine
